# Patient Record
Sex: Female | Born: 1935 | Race: Black or African American | Hispanic: No | Smoking: Never smoker
Health system: Southern US, Community
[De-identification: ages and names within clinical notes are randomized; demographics above are authoritative.]

## PROBLEM LIST (undated history)

## (undated) DIAGNOSIS — E538 Deficiency of other specified B group vitamins: Secondary | ICD-10-CM

## (undated) DIAGNOSIS — G629 Polyneuropathy, unspecified: Secondary | ICD-10-CM

## (undated) DIAGNOSIS — M722 Plantar fascial fibromatosis: Secondary | ICD-10-CM

## (undated) DIAGNOSIS — I10 Essential (primary) hypertension: Secondary | ICD-10-CM

## (undated) DIAGNOSIS — R42 Dizziness and giddiness: Secondary | ICD-10-CM

## (undated) DIAGNOSIS — K219 Gastro-esophageal reflux disease without esophagitis: Secondary | ICD-10-CM

## (undated) DIAGNOSIS — E559 Vitamin D deficiency, unspecified: Secondary | ICD-10-CM

## (undated) DIAGNOSIS — Z972 Presence of dental prosthetic device (complete) (partial): Secondary | ICD-10-CM

## (undated) DIAGNOSIS — M1990 Unspecified osteoarthritis, unspecified site: Secondary | ICD-10-CM

## (undated) DIAGNOSIS — M810 Age-related osteoporosis without current pathological fracture: Secondary | ICD-10-CM

## (undated) DIAGNOSIS — G4700 Insomnia, unspecified: Secondary | ICD-10-CM

## (undated) DIAGNOSIS — Z973 Presence of spectacles and contact lenses: Secondary | ICD-10-CM

## (undated) DIAGNOSIS — E039 Hypothyroidism, unspecified: Secondary | ICD-10-CM

## (undated) DIAGNOSIS — E785 Hyperlipidemia, unspecified: Secondary | ICD-10-CM

## (undated) DIAGNOSIS — D126 Benign neoplasm of colon, unspecified: Secondary | ICD-10-CM

## (undated) HISTORY — PX: TUBAL LIGATION: SHX77

## (undated) HISTORY — DX: Benign neoplasm of colon, unspecified: D12.6

## (undated) HISTORY — DX: Polyneuropathy, unspecified: G62.9

## (undated) HISTORY — DX: Insomnia, unspecified: G47.00

## (undated) HISTORY — DX: Hyperlipidemia, unspecified: E78.5

## (undated) HISTORY — PX: COLONOSCOPY: SHX174

## (undated) HISTORY — PX: CYSTOCELE REPAIR: SHX163

## (undated) HISTORY — DX: Deficiency of other specified B group vitamins: E53.8

## (undated) HISTORY — PX: HAND SURGERY: SHX662

## (undated) HISTORY — DX: Vitamin D deficiency, unspecified: E55.9

## (undated) HISTORY — DX: Plantar fascial fibromatosis: M72.2

## (undated) HISTORY — PX: CORRECTION HAMMER TOE: SUR317

## (undated) HISTORY — PX: RECTOCELE REPAIR: SHX761

## (undated) HISTORY — PX: TOE SURGERY: SHX1073

## (undated) HISTORY — DX: Dizziness and giddiness: R42

## (undated) HISTORY — PX: ABDOMINAL HYSTERECTOMY: SHX81

---

## 1999-07-09 ENCOUNTER — Other Ambulatory Visit: Admission: RE | Admit: 1999-07-09 | Discharge: 1999-07-09 | Payer: Self-pay | Admitting: Obstetrics and Gynecology

## 2000-08-10 ENCOUNTER — Other Ambulatory Visit: Admission: RE | Admit: 2000-08-10 | Discharge: 2000-08-10 | Payer: Self-pay | Admitting: Obstetrics and Gynecology

## 2001-08-25 ENCOUNTER — Other Ambulatory Visit: Admission: RE | Admit: 2001-08-25 | Discharge: 2001-08-25 | Payer: Self-pay | Admitting: Obstetrics and Gynecology

## 2002-04-24 ENCOUNTER — Encounter: Payer: Self-pay | Admitting: Internal Medicine

## 2002-04-24 ENCOUNTER — Ambulatory Visit (HOSPITAL_COMMUNITY): Admission: RE | Admit: 2002-04-24 | Discharge: 2002-04-24 | Payer: Self-pay | Admitting: Internal Medicine

## 2002-04-25 ENCOUNTER — Ambulatory Visit (HOSPITAL_COMMUNITY): Admission: RE | Admit: 2002-04-25 | Discharge: 2002-04-25 | Payer: Self-pay | Admitting: Internal Medicine

## 2002-04-25 ENCOUNTER — Encounter: Payer: Self-pay | Admitting: Internal Medicine

## 2003-01-29 ENCOUNTER — Encounter: Admission: RE | Admit: 2003-01-29 | Discharge: 2003-01-29 | Payer: Self-pay | Admitting: Internal Medicine

## 2003-01-29 ENCOUNTER — Encounter: Payer: Self-pay | Admitting: Internal Medicine

## 2003-03-21 ENCOUNTER — Other Ambulatory Visit: Admission: RE | Admit: 2003-03-21 | Discharge: 2003-03-21 | Payer: Self-pay | Admitting: Obstetrics and Gynecology

## 2003-10-20 HISTORY — PX: TRIGGER FINGER RELEASE: SHX641

## 2004-02-26 ENCOUNTER — Ambulatory Visit (HOSPITAL_BASED_OUTPATIENT_CLINIC_OR_DEPARTMENT_OTHER): Admission: RE | Admit: 2004-02-26 | Discharge: 2004-02-26 | Payer: Self-pay | Admitting: Orthopedic Surgery

## 2004-02-26 ENCOUNTER — Ambulatory Visit (HOSPITAL_COMMUNITY): Admission: RE | Admit: 2004-02-26 | Discharge: 2004-02-26 | Payer: Self-pay | Admitting: Orthopedic Surgery

## 2004-03-31 ENCOUNTER — Ambulatory Visit (HOSPITAL_COMMUNITY): Admission: RE | Admit: 2004-03-31 | Discharge: 2004-03-31 | Payer: Self-pay | Admitting: Obstetrics and Gynecology

## 2004-08-28 ENCOUNTER — Encounter: Admission: RE | Admit: 2004-08-28 | Discharge: 2004-08-28 | Payer: Self-pay | Admitting: Internal Medicine

## 2005-05-05 ENCOUNTER — Other Ambulatory Visit: Admission: RE | Admit: 2005-05-05 | Discharge: 2005-05-05 | Payer: Self-pay | Admitting: Obstetrics and Gynecology

## 2006-05-14 ENCOUNTER — Other Ambulatory Visit: Admission: RE | Admit: 2006-05-14 | Discharge: 2006-05-14 | Payer: Self-pay | Admitting: Obstetrics and Gynecology

## 2006-10-19 HISTORY — PX: SHOULDER ARTHROSCOPY: SHX128

## 2006-11-23 ENCOUNTER — Emergency Department (HOSPITAL_COMMUNITY): Admission: EM | Admit: 2006-11-23 | Discharge: 2006-11-23 | Payer: Self-pay | Admitting: Family Medicine

## 2007-02-04 ENCOUNTER — Encounter: Admission: RE | Admit: 2007-02-04 | Discharge: 2007-02-04 | Payer: Self-pay | Admitting: Orthopedic Surgery

## 2007-02-08 ENCOUNTER — Ambulatory Visit (HOSPITAL_BASED_OUTPATIENT_CLINIC_OR_DEPARTMENT_OTHER): Admission: RE | Admit: 2007-02-08 | Discharge: 2007-02-09 | Payer: Self-pay | Admitting: Orthopedic Surgery

## 2008-06-07 ENCOUNTER — Other Ambulatory Visit: Admission: RE | Admit: 2008-06-07 | Discharge: 2008-06-07 | Payer: Self-pay | Admitting: Obstetrics and Gynecology

## 2008-12-19 ENCOUNTER — Ambulatory Visit: Payer: Self-pay | Admitting: Internal Medicine

## 2008-12-19 DIAGNOSIS — K3184 Gastroparesis: Secondary | ICD-10-CM | POA: Insufficient documentation

## 2008-12-19 DIAGNOSIS — R1319 Other dysphagia: Secondary | ICD-10-CM | POA: Insufficient documentation

## 2008-12-20 ENCOUNTER — Encounter: Payer: Self-pay | Admitting: Internal Medicine

## 2008-12-20 ENCOUNTER — Ambulatory Visit: Payer: Self-pay | Admitting: Internal Medicine

## 2008-12-26 DIAGNOSIS — E108 Type 1 diabetes mellitus with unspecified complications: Secondary | ICD-10-CM

## 2008-12-26 DIAGNOSIS — E1065 Type 1 diabetes mellitus with hyperglycemia: Secondary | ICD-10-CM

## 2008-12-26 DIAGNOSIS — E1122 Type 2 diabetes mellitus with diabetic chronic kidney disease: Secondary | ICD-10-CM | POA: Insufficient documentation

## 2009-01-04 ENCOUNTER — Ambulatory Visit (HOSPITAL_COMMUNITY): Admission: RE | Admit: 2009-01-04 | Discharge: 2009-01-04 | Payer: Self-pay | Admitting: Internal Medicine

## 2009-02-04 ENCOUNTER — Ambulatory Visit: Payer: Self-pay | Admitting: Internal Medicine

## 2009-02-04 DIAGNOSIS — K5900 Constipation, unspecified: Secondary | ICD-10-CM | POA: Insufficient documentation

## 2009-08-08 ENCOUNTER — Ambulatory Visit: Payer: Self-pay | Admitting: Internal Medicine

## 2009-08-08 DIAGNOSIS — R1031 Right lower quadrant pain: Secondary | ICD-10-CM | POA: Insufficient documentation

## 2009-08-20 ENCOUNTER — Encounter: Payer: Self-pay | Admitting: Internal Medicine

## 2009-08-20 ENCOUNTER — Ambulatory Visit: Payer: Self-pay | Admitting: Internal Medicine

## 2009-08-23 ENCOUNTER — Encounter: Payer: Self-pay | Admitting: Internal Medicine

## 2010-11-28 ENCOUNTER — Ambulatory Visit: Payer: Medicare Other | Attending: Internal Medicine | Admitting: Physical Therapy

## 2010-11-28 DIAGNOSIS — R262 Difficulty in walking, not elsewhere classified: Secondary | ICD-10-CM | POA: Insufficient documentation

## 2010-11-28 DIAGNOSIS — M6281 Muscle weakness (generalized): Secondary | ICD-10-CM | POA: Insufficient documentation

## 2010-11-28 DIAGNOSIS — IMO0001 Reserved for inherently not codable concepts without codable children: Secondary | ICD-10-CM | POA: Insufficient documentation

## 2010-12-02 ENCOUNTER — Ambulatory Visit: Payer: Medicare Other | Admitting: Physical Therapy

## 2010-12-04 ENCOUNTER — Ambulatory Visit: Payer: Medicare Other | Admitting: Physical Therapy

## 2010-12-08 ENCOUNTER — Ambulatory Visit: Payer: Medicare Other | Admitting: Physical Therapy

## 2010-12-16 ENCOUNTER — Ambulatory Visit: Payer: Medicare Other | Admitting: Physical Therapy

## 2010-12-18 ENCOUNTER — Ambulatory Visit: Payer: Medicare Other | Attending: Internal Medicine | Admitting: Physical Therapy

## 2010-12-18 DIAGNOSIS — M6281 Muscle weakness (generalized): Secondary | ICD-10-CM | POA: Insufficient documentation

## 2010-12-18 DIAGNOSIS — IMO0001 Reserved for inherently not codable concepts without codable children: Secondary | ICD-10-CM | POA: Insufficient documentation

## 2010-12-18 DIAGNOSIS — R262 Difficulty in walking, not elsewhere classified: Secondary | ICD-10-CM | POA: Insufficient documentation

## 2010-12-23 ENCOUNTER — Ambulatory Visit: Payer: Medicare Other | Admitting: Physical Therapy

## 2010-12-25 ENCOUNTER — Ambulatory Visit: Payer: Medicare Other | Admitting: Physical Therapy

## 2011-01-21 LAB — GLUCOSE, CAPILLARY
Glucose-Capillary: 102 mg/dL — ABNORMAL HIGH (ref 70–99)
Glucose-Capillary: 122 mg/dL — ABNORMAL HIGH (ref 70–99)

## 2011-01-29 LAB — GLUCOSE, CAPILLARY
Glucose-Capillary: 81 mg/dL (ref 70–99)
Glucose-Capillary: 85 mg/dL (ref 70–99)

## 2011-03-06 NOTE — Op Note (Signed)
NAMEJANY, Gabriela Jefferson              ACCOUNT NO.:  [id number]   MEDICAL RECORD NO.:  [id number]          PATIENT TYPE:  AMB   LOCATION:  DSC                          FACILITY:  [facility]   PHYSICIAN:  Katy Fitch. Sypher, M.D. DATE OF BIRTH:  [birth year]   DATE OF PROCEDURE:  02/08/2007  DATE OF DISCHARGE:                               OPERATIVE REPORT   PREOPERATIVE DIAGNOSIS:  Chronic stage III impingement left shoulder  with MRI evidence of full-thickness supraspinous rotator cuff tear and  extensive partial-thickness rotator cuff tear involving supraspinatus  and infraspinatus and partial subscapularis tear.  Also unfavorable AC  anatomy with a type 3 anterior acromion and medial distal clavicle  osteophyte.   POSTOPERATIVE DIAGNOSIS:  Chronic stage III impingement left shoulder  with MRI evidence of full-thickness supraspinous rotator cuff tear and  extensive partial-thickness rotator cuff tear involving supraspinatus  and infraspinatus and partial subscapularis tear.  Also unfavorable AC  anatomy with a type 3 anterior acromion and medial distal clavicle  osteophyte, with confirmation of 95% full-thickness tear of anterior  supraspinatus and extensive partial-thickness tearing of supraspinatus,  infraspinatus and subscapularis.   OPERATION:  1. Diagnostic arthroscopy, left glenohumeral joint.  2. Arthroscopic debridement of subscapularis, supraspinatus,      infraspinatus tendons.  3. Open subacromial decompression with coracoacromial ligament release      acromioplasty, resection of medial acromial osteophyte.  4. Open resection of distal clavicle.  5. Repair of supraspinatus anterior rotator cuff tear with      biocorkscrew anchor and decortication of greater tuberosity.   OPERATING SURGEON:  Josephine Igo, M.D.   ASSISTANT:  Molly Maduro Dasnoit PA-C.   ANESTHESIA:  General endotracheal supplemented by a left interscalene  block, supervising anesthesiologist is Dr. Noreene Larsson.   INDICATIONS:  Gabriela Jefferson is a 77 year old woman with type 2 diabetes  and hypertension referred by Dr. Creola Corn for evaluation and  management of a painful left shoulder.  Clinical examination revealed  pain on abduction external rotation, weakness of abduction consistent  with possible rotator cuff tear.  An MRI was obtained at [facility]  on January 01, 2007 interpreted by Dr. Frederica Kuster to reveal  evidence of a full-thickness perforation across the distal anterior  supraspinatus tendon, superimposed on extensive high-grade partial  thickness articular surface tear of supraspinatus, infraspinatus and  subscapularis.   Dr. Suzie Portela also documented significant AC arthropathy that was well  visualized on plain x-ray.   Due to failure to respond to nonoperative measures.  Gabriela Jefferson is  brought to the operating this time for diagnostic arthroscopy of her  left glenohumeral joint, anticipating debridement of rotator cuff,  subacromial decompression and probable resection of distal clavicle and  probable repair of the rotator cuff.   After informed consent, she is brought to the operating this time.  Preoperatively she was advised potential risks, benefits surgery.  She  is evaluated by Dr. Noreene Larsson preoperatively for anesthesia consult.  Her  diabetes was noted be in moderate control.  Her blood pressure was in  control.   Preoperatively questions invited, answered.   PROCEDURE:  Almon Register  is brought to the operating room and placed  in supine position on the operating table.   Following an anesthesia consult by Dr. Noreene Larsson, an interscalene block was  placed holding area with excellent anesthesia of left arm and  forequarter.  Gabriela Jefferson was then transferred to room 1, placed supine  position on the table and under Dr. Morley Kos direct supervision, general  endotracheal anesthesia induced.   Gabriela Jefferson was carefully positioned in the beach-chair position with the  aid  of a torso and head holder designed for shoulder arthroscopy.  The  left arm was prepped with DuraPrep and draped with impervious  arthroscopy drapes.   The arthroscope was introduced through a standard posterior viewing  portal.   Diagnostic arthroscopy of the left glenohumeral joint confirmed near  normal hyaline articular cartilage surfaces of the glenoid and humeral  head.  There is extensive labral degenerative tearing which required  debridement with a suction shaver brought in anteriorly.  The long head  biceps had a stable anchor at the superior glenoid and biceps tendon was  normal to the rotator interval.   There was a moderate degree of degenerative tearing of the subscapularis  on the articular surface of the tendon.  This was not full-thickness.  The capsule was intact.  The deep surface of the supraspinatus,  infraspinatus was well visualized.  There was about a 60-90% tear of the  anterior supraspinatus with a small full-thickness perforation apparent.  The deep surface of the infraspinatus revealed a 50% deep surface tear.   A suction shaver was used to remove all fragments of tendon hanging  within the joint.  After photographic documentation of the tendon  pathology, the arthroscope was removed from glenohumeral joint and  proceeded directly to open repair of the cuff with subacromial  decompression, distal clavicle resection.   A 7 cm incision was fashioned from the distal clavicle across the  anterior acromion.  The anterior and middle thirds of deltoid was split.  The bursa was entered.  There was a very significant ossification of  coracoacromial ligament that was identified, subsequently resected with  a rongeur.  The type 3 acromion was corrected with the use of an  oscillating saw and a large rongeur was used to remove the medial  osteophyte at the medial acromial portion of the [facility] joint.  The distal 15 mm of clavicle was exposed subperiosteally and the  distal clavicle  removed the oscillating saw.   The bursa was visually inspected and palpated digitally.  The adhesions  were released.  The rotator cuff tear region was inspected.  There is a  thin bursal layer still protecting the rotator cuff.  A longitudinal  incision was fashioned 1 cm posterior to the biceps interval followed by  thorough debridement of degenerative rotator cuff tendons in the  watershed region.  The greater tuberosity was decorticated by hand with  a curette followed by placement of single bio corkscrew anchor.  A  baseball stitch type closure was used to close the incision and reinsert  the supraspinatus into the decorticated greater tuberosity.  A very  satisfactory low-profile repair was achieved.   After power irrigation of the AC region bursa and subacromial space, the  edge of the acromion was removed with a hand rasp followed by repair of  the anterior third of deltoid to the capsule of the [facility] joint and  trapezius muscle closing the dead space at the site of the distal  clavicle removal  followed by repair of the anterior third of deltoid  anatomically to the periosteum of the acromion.   A finishing suture of 0 Vicryl was used to close the deltoid split and  to reinforce the repair of the deltoid to the acromion and AC capsule.   The wound was then irrigated followed by repair of the skin with  subdermal sutures of 2-0 Vicryl and intradermal 3-0 Prolene with Steri-  Strips.   There no apparent complications.   Gabriela Jefferson tolerated surgery anesthesia well.  She is transferred  recovery room with stable signs..  She will be admitted to recovery care  center for observation vital signs and analgesics the form of p.o. and  IV Dilaudid and IV PCA morphine.   She will be discharged 24 hours care her family with a prescriptions for  Dilaudid 2 mg one p.o. every 4 to 6 hours as needed for pain 30 tablets  without refill, Motrin 600 mg one p.o. q.6 h as  needed for pain 30  tablets one refill and Keflex 500 mg one p.o. q. 8 hours x4 days as a  prophylactic antibiotic.      Katy Fitch Sypher, M.D.  Electronically Signed     RVS/MEDQ  D:  02/08/2007  T:  02/08/2007  Job:  [id number]   cc:   Gwen Pounds, MD

## 2011-03-06 NOTE — Op Note (Signed)
NAME:  Gabriela Jefferson, Gabriela Jefferson                        ACCOUNT NO.:  [id number]   MEDICAL RECORD NO.:  [id number]                   PATIENT TYPE:  AMB   LOCATION:  DSC                                  FACILITY:  [facility]   PHYSICIAN:  Katy Fitch. Naaman Plummer., M.D.          DATE OF BIRTH:  Apr 27, 1932   DATE OF PROCEDURE:  02/26/2004  DATE OF DISCHARGE:                                 OPERATIVE REPORT   PREOPERATIVE DIAGNOSIS:  Locking stenosing tenosynovitis, left ring finger  at A-1 pulley.   POSTOPERATIVE DIAGNOSIS:  Locking stenosing tenosynovitis, left ring finger  at A-1 pulley.   OPERATION PERFORMED:  Release of left ring finger A-1 pulley.   SURGEON:  Katy Fitch. Sypher, M.D.   ASSISTANT:  Jonni Sanger, P.A.   ANESTHESIA:  0.25% Marcaine and 2% lidocaine, metacarpal head level block of  left ring finger supplemented by IV sedation.   SUPERVISING ANESTHESIOLOGIST:  Judie Petit, M.D.   INDICATIONS FOR PROCEDURE:  Gabriela Jefferson is a 77 year old woman referred  by Dr. Timothy Lasso of [facility] for evaluation of stenosing  tenosynovitis.  Gabriela Jefferson has had chronic locking of her left ring finger.  This  has not responded to nonoperative measures.  Gabriela Jefferson requested release of the A-  1 pulley.  Gabriela Jefferson has had previous surgery to the right hand for this  predicament.  After informed consent Gabriela Jefferson is brought to the operating room.   DESCRIPTION OF PROCEDURE:  Gabriela Jefferson was brought to the operating room  and placed in supine position on the operating table.  Following light  sedation, the left arm was prepped with Betadine soap and solution and  sterilely draped.  Following exsanguination of the left arm with an Esmarch  bandage, an arterial tourniquet on the proximal brachium to 250 mmHg.  The  procedure commenced with a short oblique incision distal to the palmar  crease.  Subcutaneous tissues were carefully divided taking care to identify  the A-1 pulley.  This was gently  exposed with use of blunt retractors.  The  pulley was then split in the midline releasing the A-1 pulley completely.  Thereafter full range of motion of the finger was recovered.  The tendons  were delivered and a minor synovectomy was performed.  The wound was then  repaired with interrupted suture of 5-0 nylon.  A compressive dressing was  applied with Xeroflo, sterile gauze and Ace wrap.   Gabriela Jefferson was advised to begin immediate range of motion exercises.  Gabriela Jefferson  will return to the office for follow-up in one week for wound evaluation and  advancement to an exercise program.                                               Katy Fitch. Naaman Plummer., M.D.  RVS/MEDQ  D:  02/26/2004  T:  02/26/2004  Job:  [id number]   cc:   Gwen Pounds, M.D.  [address]  [place]  [place] [address]  Fax: [phone]

## 2011-04-22 ENCOUNTER — Emergency Department (HOSPITAL_COMMUNITY)
Admission: EM | Admit: 2011-04-22 | Discharge: 2011-04-22 | Disposition: A | Discharge status: Home / Self Care | Payer: Medicare Other | Attending: Emergency Medicine | Admitting: Emergency Medicine

## 2011-04-22 ENCOUNTER — Emergency Department (HOSPITAL_COMMUNITY): Payer: Medicare Other

## 2011-04-22 DIAGNOSIS — J3489 Other specified disorders of nose and nasal sinuses: Secondary | ICD-10-CM | POA: Insufficient documentation

## 2011-04-22 DIAGNOSIS — J45909 Unspecified asthma, uncomplicated: Secondary | ICD-10-CM | POA: Insufficient documentation

## 2011-04-22 DIAGNOSIS — IMO0001 Reserved for inherently not codable concepts without codable children: Secondary | ICD-10-CM | POA: Insufficient documentation

## 2011-04-22 DIAGNOSIS — Z79899 Other long term (current) drug therapy: Secondary | ICD-10-CM | POA: Insufficient documentation

## 2011-04-22 DIAGNOSIS — M810 Age-related osteoporosis without current pathological fracture: Secondary | ICD-10-CM | POA: Insufficient documentation

## 2011-04-22 DIAGNOSIS — E039 Hypothyroidism, unspecified: Secondary | ICD-10-CM | POA: Insufficient documentation

## 2011-04-22 DIAGNOSIS — E7800 Pure hypercholesterolemia, unspecified: Secondary | ICD-10-CM | POA: Insufficient documentation

## 2011-04-22 DIAGNOSIS — I10 Essential (primary) hypertension: Secondary | ICD-10-CM | POA: Insufficient documentation

## 2011-04-22 DIAGNOSIS — M129 Arthropathy, unspecified: Secondary | ICD-10-CM | POA: Insufficient documentation

## 2011-04-22 DIAGNOSIS — R6883 Chills (without fever): Secondary | ICD-10-CM | POA: Insufficient documentation

## 2011-04-22 DIAGNOSIS — R059 Cough, unspecified: Secondary | ICD-10-CM | POA: Insufficient documentation

## 2011-04-22 DIAGNOSIS — E119 Type 2 diabetes mellitus without complications: Secondary | ICD-10-CM | POA: Insufficient documentation

## 2011-04-22 DIAGNOSIS — R05 Cough: Secondary | ICD-10-CM | POA: Insufficient documentation

## 2011-04-22 LAB — DIFFERENTIAL
Basophils Absolute: 0 10*3/uL (ref 0.0–0.1)
Basophils Relative: 0 % (ref 0–1)
Eosinophils Absolute: 0 10*3/uL (ref 0.0–0.7)
Eosinophils Relative: 0 % (ref 0–5)
Lymphocytes Relative: 29 % (ref 12–46)
Lymphs Abs: 2.1 10*3/uL (ref 0.7–4.0)
Monocytes Absolute: 0.7 10*3/uL (ref 0.1–1.0)
Monocytes Relative: 10 % (ref 3–12)
Neutro Abs: 4.4 10*3/uL (ref 1.7–7.7)
Neutrophils Relative %: 61 % (ref 43–77)

## 2011-04-22 LAB — BASIC METABOLIC PANEL
BUN: 13 mg/dL (ref 6–23)
CO2: 26 mEq/L (ref 19–32)
Calcium: 9.2 mg/dL (ref 8.4–10.5)
Chloride: 101 mEq/L (ref 96–112)
Creatinine, Ser: 0.94 mg/dL (ref 0.50–1.10)
GFR calc Af Amer: >60 mL/min (ref >60)
GFR calc non Af Amer: 58 mL/min — ABNORMAL LOW (ref >60)
Glucose, Bld: 175 mg/dL — ABNORMAL HIGH (ref 70–99)
Potassium: 4.1 mEq/L (ref 3.5–5.1)
Sodium: 138 mEq/L (ref 135–145)

## 2011-04-22 LAB — CBC
HCT: 34.6 % — ABNORMAL LOW (ref 36.0–46.0)
Hemoglobin: 11.7 g/dL — ABNORMAL LOW (ref 12.0–15.0)
MCH: 30.2 pg (ref 26.0–34.0)
MCHC: 33.8 g/dL (ref 30.0–36.0)
MCV: 89.4 fL (ref 78.0–100.0)
Platelets: 226 10*3/uL (ref 150–400)
RBC: 3.87 MIL/uL (ref 3.87–5.11)
RDW: 12.8 % (ref 11.5–15.5)
WBC: 7.2 10*3/uL (ref 4.0–10.5)

## 2011-11-27 DIAGNOSIS — L84 Corns and callosities: Secondary | ICD-10-CM | POA: Diagnosis not present

## 2011-11-27 DIAGNOSIS — L608 Other nail disorders: Secondary | ICD-10-CM | POA: Diagnosis not present

## 2011-11-27 DIAGNOSIS — E119 Type 2 diabetes mellitus without complications: Secondary | ICD-10-CM | POA: Diagnosis not present

## 2011-12-29 DIAGNOSIS — M899 Disorder of bone, unspecified: Secondary | ICD-10-CM | POA: Diagnosis not present

## 2011-12-29 DIAGNOSIS — E039 Hypothyroidism, unspecified: Secondary | ICD-10-CM | POA: Diagnosis not present

## 2011-12-29 DIAGNOSIS — E559 Vitamin D deficiency, unspecified: Secondary | ICD-10-CM | POA: Diagnosis not present

## 2011-12-29 DIAGNOSIS — E785 Hyperlipidemia, unspecified: Secondary | ICD-10-CM | POA: Diagnosis not present

## 2011-12-29 DIAGNOSIS — M949 Disorder of cartilage, unspecified: Secondary | ICD-10-CM | POA: Diagnosis not present

## 2011-12-29 DIAGNOSIS — E119 Type 2 diabetes mellitus without complications: Secondary | ICD-10-CM | POA: Diagnosis not present

## 2011-12-29 DIAGNOSIS — R82998 Other abnormal findings in urine: Secondary | ICD-10-CM | POA: Diagnosis not present

## 2011-12-29 DIAGNOSIS — E538 Deficiency of other specified B group vitamins: Secondary | ICD-10-CM | POA: Diagnosis not present

## 2012-01-05 DIAGNOSIS — Z0000 Encounter for general adult medical examination without abnormal findings: Secondary | ICD-10-CM | POA: Diagnosis not present

## 2012-01-05 DIAGNOSIS — E119 Type 2 diabetes mellitus without complications: Secondary | ICD-10-CM | POA: Diagnosis not present

## 2012-01-05 DIAGNOSIS — E785 Hyperlipidemia, unspecified: Secondary | ICD-10-CM | POA: Diagnosis not present

## 2012-01-05 DIAGNOSIS — M810 Age-related osteoporosis without current pathological fracture: Secondary | ICD-10-CM | POA: Diagnosis not present

## 2012-01-13 DIAGNOSIS — M19049 Primary osteoarthritis, unspecified hand: Secondary | ICD-10-CM | POA: Diagnosis not present

## 2012-01-13 DIAGNOSIS — M6530 Trigger finger, unspecified finger: Secondary | ICD-10-CM | POA: Diagnosis not present

## 2012-01-27 DIAGNOSIS — M19049 Primary osteoarthritis, unspecified hand: Secondary | ICD-10-CM | POA: Diagnosis not present

## 2012-01-27 DIAGNOSIS — M6530 Trigger finger, unspecified finger: Secondary | ICD-10-CM | POA: Diagnosis not present

## 2012-02-02 DIAGNOSIS — Z1212 Encounter for screening for malignant neoplasm of rectum: Secondary | ICD-10-CM | POA: Diagnosis not present

## 2012-02-11 DIAGNOSIS — Z1231 Encounter for screening mammogram for malignant neoplasm of breast: Secondary | ICD-10-CM | POA: Diagnosis not present

## 2012-03-08 DIAGNOSIS — E119 Type 2 diabetes mellitus without complications: Secondary | ICD-10-CM | POA: Diagnosis not present

## 2012-03-08 DIAGNOSIS — L608 Other nail disorders: Secondary | ICD-10-CM | POA: Diagnosis not present

## 2012-03-08 DIAGNOSIS — Q6689 Other  specified congenital deformities of feet: Secondary | ICD-10-CM | POA: Diagnosis not present

## 2012-03-08 DIAGNOSIS — L84 Corns and callosities: Secondary | ICD-10-CM | POA: Diagnosis not present

## 2012-05-05 DIAGNOSIS — I10 Essential (primary) hypertension: Secondary | ICD-10-CM | POA: Diagnosis not present

## 2012-05-05 DIAGNOSIS — K219 Gastro-esophageal reflux disease without esophagitis: Secondary | ICD-10-CM | POA: Diagnosis not present

## 2012-05-05 DIAGNOSIS — K5909 Other constipation: Secondary | ICD-10-CM | POA: Diagnosis not present

## 2012-05-05 DIAGNOSIS — E119 Type 2 diabetes mellitus without complications: Secondary | ICD-10-CM | POA: Diagnosis not present

## 2012-05-10 DIAGNOSIS — E119 Type 2 diabetes mellitus without complications: Secondary | ICD-10-CM | POA: Diagnosis not present

## 2012-05-10 DIAGNOSIS — L608 Other nail disorders: Secondary | ICD-10-CM | POA: Diagnosis not present

## 2012-05-10 DIAGNOSIS — I872 Venous insufficiency (chronic) (peripheral): Secondary | ICD-10-CM | POA: Diagnosis not present

## 2012-05-10 DIAGNOSIS — M79609 Pain in unspecified limb: Secondary | ICD-10-CM | POA: Diagnosis not present

## 2012-05-18 DIAGNOSIS — E119 Type 2 diabetes mellitus without complications: Secondary | ICD-10-CM | POA: Diagnosis not present

## 2012-05-19 DIAGNOSIS — E039 Hypothyroidism, unspecified: Secondary | ICD-10-CM | POA: Diagnosis not present

## 2012-05-19 DIAGNOSIS — I10 Essential (primary) hypertension: Secondary | ICD-10-CM | POA: Diagnosis not present

## 2012-05-19 DIAGNOSIS — K5909 Other constipation: Secondary | ICD-10-CM | POA: Diagnosis not present

## 2012-05-19 DIAGNOSIS — K219 Gastro-esophageal reflux disease without esophagitis: Secondary | ICD-10-CM | POA: Diagnosis not present

## 2012-06-27 DIAGNOSIS — M6530 Trigger finger, unspecified finger: Secondary | ICD-10-CM | POA: Diagnosis not present

## 2012-07-06 ENCOUNTER — Encounter (HOSPITAL_BASED_OUTPATIENT_CLINIC_OR_DEPARTMENT_OTHER): Payer: Self-pay | Admitting: *Deleted

## 2012-07-06 NOTE — Progress Notes (Signed)
Going to dr Timothy Lasso in am for lab-called them to see if they would get bmet and fax here. Pt to come in for ekg Has been here for TF and shoulder surgery

## 2012-07-07 DIAGNOSIS — Z23 Encounter for immunization: Secondary | ICD-10-CM | POA: Diagnosis not present

## 2012-07-07 DIAGNOSIS — E119 Type 2 diabetes mellitus without complications: Secondary | ICD-10-CM | POA: Diagnosis not present

## 2012-07-07 DIAGNOSIS — I10 Essential (primary) hypertension: Secondary | ICD-10-CM | POA: Diagnosis not present

## 2012-07-07 DIAGNOSIS — E039 Hypothyroidism, unspecified: Secondary | ICD-10-CM | POA: Diagnosis not present

## 2012-07-07 DIAGNOSIS — E785 Hyperlipidemia, unspecified: Secondary | ICD-10-CM | POA: Diagnosis not present

## 2012-07-07 DIAGNOSIS — E538 Deficiency of other specified B group vitamins: Secondary | ICD-10-CM | POA: Diagnosis not present

## 2012-07-08 DIAGNOSIS — L608 Other nail disorders: Secondary | ICD-10-CM | POA: Diagnosis not present

## 2012-07-08 DIAGNOSIS — E119 Type 2 diabetes mellitus without complications: Secondary | ICD-10-CM | POA: Diagnosis not present

## 2012-07-08 DIAGNOSIS — L84 Corns and callosities: Secondary | ICD-10-CM | POA: Diagnosis not present

## 2012-07-11 NOTE — H&P (Signed)
  Gabriela Jefferson is an 78 y.o. female.   Chief Complaint: c/o chronic and progressive STS symptoms right long finger HPI: [name] noted significant pain at the base of her right thumb and triggering of her right long finger which had been present for three months.  She is now referred for an upper extremity orthopaedic consult.     Past Medical History  Diagnosis Date  . Hypertension   . Diabetes mellitus   . Arthritis   . Osteoporosis   . GERD (gastroesophageal reflux disease)   . Hypothyroidism   . Wears glasses   . Wears dentures     upper    Past Surgical History  Procedure Date  . Shoulder arthroscopy 2008    left  . Trigger finger release 2005    lt ring  . Cystocele repair   . Rectocele repair   . Abdominal hysterectomy   . Correction hammer toe     left foot  . Tubal ligation   . Colonoscopy     No family history on file. Social History:  reports that she has never smoked. She does not have any smokeless tobacco history on file. She reports that she does not drink alcohol or use illicit drugs.  Allergies: No Known Allergies  No prescriptions prior to admission    No results found for this or any previous visit (from the past 48 hour(s)).  No results found.   Pertinent items are noted in HPI.  Height 5\' 7"  (1.702 m), weight 86.183 kg (190 lb).  General appearance: alert Head: Normocephalic, without obvious abnormality Neck: supple, symmetrical, trachea midline Resp: clear to auscultation bilaterally Cardio: regular rate and rhythm GI: normal findings: bowel sounds normal Extremities:.  Inspection of her hand reveals stigmata of osteoarthritis including thumb shouldering bilaterally.  She has pain with CMC translation and grind on the right. She has Heberden's nodes.  She has active triggering of her right long finger in flexion. She has full range of motion of her index, ring and small fingers on the right, all fingers and thumb on the left.  Her pulse  and cap refill are intact.  She has no sign of stenosing tenosynovitis of the first dorsal compartment. She has full range of motion of her left shoulder without pain.    X-rays of her right hand demonstrate Eaton stage III CMC arthrosis.  She has narrowing of her index MP joint and distal interphalangeal joint narrowing, thumb IP joint narrowing.    Pulses: 2+ and symmetric Skin: normal Neurologic: Grossly normal    ASSESSMENT:     1)  Background osteoarthritis.       2)  Stenosing tenosynovitis right long finger.    3)  CMC arthrosis right thumb. Plan: To the OR for release A-1 pulley right long finger and injection of right thumb CMC joint.The procedure, risks,benefits and post-op course were discussed with the patient at length and they were in agreement with the plan.     DASNOIT,Jacob Cicero J 07/11/2012, 4:49 PM    H&P documentation: 07/12/2012  -History and Physical Reviewed  -Patient has been re-examined  -No change in the plan of care  Wyn Forster, MD

## 2012-07-12 ENCOUNTER — Encounter (HOSPITAL_BASED_OUTPATIENT_CLINIC_OR_DEPARTMENT_OTHER): Payer: Self-pay | Admitting: Anesthesiology

## 2012-07-12 ENCOUNTER — Encounter (HOSPITAL_BASED_OUTPATIENT_CLINIC_OR_DEPARTMENT_OTHER)
Admission: RE | Disposition: A | Discharge status: Home / Self Care | Payer: Self-pay | Source: Ambulatory Visit | Attending: Orthopedic Surgery

## 2012-07-12 ENCOUNTER — Ambulatory Visit (HOSPITAL_BASED_OUTPATIENT_CLINIC_OR_DEPARTMENT_OTHER)
Admission: RE | Admit: 2012-07-12 | Discharge: 2012-07-12 | Disposition: A | Discharge status: Home / Self Care | Payer: Medicare Other | Source: Ambulatory Visit | Attending: Orthopedic Surgery | Admitting: Orthopedic Surgery

## 2012-07-12 ENCOUNTER — Ambulatory Visit (HOSPITAL_BASED_OUTPATIENT_CLINIC_OR_DEPARTMENT_OTHER): Payer: Medicare Other | Admitting: Anesthesiology

## 2012-07-12 ENCOUNTER — Encounter (HOSPITAL_BASED_OUTPATIENT_CLINIC_OR_DEPARTMENT_OTHER): Payer: Self-pay | Admitting: *Deleted

## 2012-07-12 DIAGNOSIS — M65849 Other synovitis and tenosynovitis, unspecified hand: Secondary | ICD-10-CM | POA: Diagnosis not present

## 2012-07-12 DIAGNOSIS — I4891 Unspecified atrial fibrillation: Secondary | ICD-10-CM | POA: Diagnosis not present

## 2012-07-12 DIAGNOSIS — M6240 Contracture of muscle, unspecified site: Secondary | ICD-10-CM | POA: Insufficient documentation

## 2012-07-12 DIAGNOSIS — M19049 Primary osteoarthritis, unspecified hand: Secondary | ICD-10-CM | POA: Diagnosis not present

## 2012-07-12 DIAGNOSIS — E119 Type 2 diabetes mellitus without complications: Secondary | ICD-10-CM | POA: Insufficient documentation

## 2012-07-12 DIAGNOSIS — M65839 Other synovitis and tenosynovitis, unspecified forearm: Secondary | ICD-10-CM | POA: Diagnosis not present

## 2012-07-12 DIAGNOSIS — M6530 Trigger finger, unspecified finger: Secondary | ICD-10-CM | POA: Diagnosis not present

## 2012-07-12 HISTORY — DX: Unspecified osteoarthritis, unspecified site: M19.90

## 2012-07-12 HISTORY — PX: TRIGGER FINGER RELEASE: SHX641

## 2012-07-12 HISTORY — PX: STERIOD INJECTION: SHX5046

## 2012-07-12 HISTORY — DX: Gastro-esophageal reflux disease without esophagitis: K21.9

## 2012-07-12 HISTORY — DX: Essential (primary) hypertension: I10

## 2012-07-12 HISTORY — DX: Hypothyroidism, unspecified: E03.9

## 2012-07-12 HISTORY — DX: Age-related osteoporosis without current pathological fracture: M81.0

## 2012-07-12 HISTORY — DX: Presence of spectacles and contact lenses: Z97.3

## 2012-07-12 HISTORY — DX: Presence of dental prosthetic device (complete) (partial): Z97.2

## 2012-07-12 LAB — POCT I-STAT, CHEM 8
BUN: 17 mg/dL (ref 6–23)
Calcium, Ion: 1.14 mmol/L (ref 1.13–1.30)
Chloride: 105 mEq/L (ref 96–112)
Creatinine, Ser: 1 mg/dL (ref 0.50–1.10)
Glucose, Bld: 111 mg/dL — ABNORMAL HIGH (ref 70–99)
Potassium: 3.8 mEq/L (ref 3.5–5.1)

## 2012-07-12 SURGERY — RELEASE, A1 PULLEY, FOR TRIGGER FINGER
Anesthesia: Monitor Anesthesia Care | Site: Thumb | Laterality: Right | Wound class: Clean

## 2012-07-12 MED ORDER — OXYCODONE HCL 5 MG/5ML PO SOLN: 5.0000 mg | Freq: Once | ORAL | Status: DC | PRN

## 2012-07-12 MED ORDER — DEXAMETHASONE SODIUM PHOSPHATE 10 MG/ML IJ SOLN
INTRAMUSCULAR | Status: DC | PRN
  Administered 2012-07-12: 10 mg via INTRAVENOUS

## 2012-07-12 MED ORDER — FENTANYL CITRATE 0.05 MG/ML IJ SOLN
INTRAMUSCULAR | Status: DC | PRN
  Administered 2012-07-12 (×2): 50 ug via INTRAVENOUS

## 2012-07-12 MED ORDER — METHYLPREDNISOLONE ACETATE 40 MG/ML IJ SUSP
INTRAMUSCULAR | Status: DC | PRN
  Administered 2012-07-12: 40 mg via INTRA_ARTICULAR

## 2012-07-12 MED ORDER — ONDANSETRON HCL 4 MG/2ML IJ SOLN
INTRAMUSCULAR | Status: DC | PRN
  Administered 2012-07-12: 4 mg via INTRAVENOUS

## 2012-07-12 MED ORDER — LIDOCAINE HCL 2 % IJ SOLN
INTRAMUSCULAR | Status: DC | PRN
  Administered 2012-07-12: 2 mL

## 2012-07-12 MED ORDER — LACTATED RINGERS IV SOLN
INTRAVENOUS | Status: DC
  Administered 2012-07-12: 07:00:00 via INTRAVENOUS

## 2012-07-12 MED ORDER — DROPERIDOL 2.5 MG/ML IJ SOLN: 0.6250 mg | INTRAMUSCULAR | Status: DC | PRN

## 2012-07-12 MED ORDER — OXYCODONE HCL 5 MG PO TABS: 5.0000 mg | ORAL_TABLET | Freq: Once | ORAL | Status: DC | PRN

## 2012-07-12 MED ORDER — PROPOFOL 10 MG/ML IV BOLUS
INTRAVENOUS | Status: DC | PRN
  Administered 2012-07-12: 60 mg via INTRAVENOUS

## 2012-07-12 MED ORDER — TRAMADOL HCL 50 MG PO TABS: ORAL_TABLET | ORAL | Status: DC

## 2012-07-12 MED ORDER — LIDOCAINE HCL (CARDIAC) 20 MG/ML IV SOLN
INTRAVENOUS | Status: DC | PRN
  Administered 2012-07-12: 50 mg via INTRAVENOUS

## 2012-07-12 MED ORDER — HYDROMORPHONE HCL PF 1 MG/ML IJ SOLN: 0.2500 mg | INTRAMUSCULAR | Status: DC | PRN

## 2012-07-12 SURGICAL SUPPLY — 40 items
BANDAGE ADHESIVE 1X3 (GAUZE/BANDAGES/DRESSINGS) ×4 IMPLANT
BLADE SURG 15 STRL LF DISP TIS (BLADE) ×2 IMPLANT
BLADE SURG 15 STRL SS (BLADE) ×3
BNDG CMPR 9X4 STRL LF SNTH (GAUZE/BANDAGES/DRESSINGS) ×2
BNDG CMPR MD 5X2 ELC HKLP STRL (GAUZE/BANDAGES/DRESSINGS) ×2
BNDG ELASTIC 2 VLCR STRL LF (GAUZE/BANDAGES/DRESSINGS) ×3 IMPLANT
BNDG ESMARK 4X9 LF (GAUZE/BANDAGES/DRESSINGS) ×1 IMPLANT
BRUSH SCRUB EZ PLAIN DRY (MISCELLANEOUS) ×3 IMPLANT
CLOTH BEACON ORANGE TIMEOUT ST (SAFETY) ×3 IMPLANT
CORDS BIPOLAR (ELECTRODE) ×2 IMPLANT
COVER MAYO STAND STRL (DRAPES) ×3 IMPLANT
COVER TABLE BACK 60X90 (DRAPES) ×3 IMPLANT
CUFF TOURNIQUET SINGLE 18IN (TOURNIQUET CUFF) ×3 IMPLANT
DECANTER SPIKE VIAL GLASS SM (MISCELLANEOUS) IMPLANT
DRAPE EXTREMITY T 121X128X90 (DRAPE) ×3 IMPLANT
DRAPE SURG 17X23 STRL (DRAPES) ×3 IMPLANT
GAUZE SPONGE 4X4 12PLY STRL LF (GAUZE/BANDAGES/DRESSINGS) ×4 IMPLANT
GAUZE XEROFORM 1X8 LF (GAUZE/BANDAGES/DRESSINGS) ×2 IMPLANT
GLOVE BIO SURGEON STRL SZ 6.5 (GLOVE) ×1 IMPLANT
GLOVE BIOGEL M STRL SZ7.5 (GLOVE) ×3 IMPLANT
GLOVE ORTHO TXT STRL SZ7.5 (GLOVE) ×3 IMPLANT
GLOVE SURG ORTHO 7.0 STRL STRW (GLOVE) ×1 IMPLANT
GOWN PREVENTION PLUS XLARGE (GOWN DISPOSABLE) ×3 IMPLANT
GOWN STRL REIN XL XLG (GOWN DISPOSABLE) ×6 IMPLANT
NEEDLE 27GAX1X1/2 (NEEDLE) ×2 IMPLANT
PACK BASIN DAY SURGERY FS (CUSTOM PROCEDURE TRAY) ×3 IMPLANT
PAD ALCOHOL SWAB (MISCELLANEOUS) ×3 IMPLANT
PAD CAST 4YDX4 CTTN HI CHSV (CAST SUPPLIES) ×2 IMPLANT
PADDING CAST COTTON 4X4 STRL (CAST SUPPLIES)
SPONGE GAUZE 4X4 12PLY (GAUZE/BANDAGES/DRESSINGS) ×2 IMPLANT
STOCKINETTE 4X48 STRL (DRAPES) ×3 IMPLANT
STRIP CLOSURE SKIN 1/2X4 (GAUZE/BANDAGES/DRESSINGS) ×1 IMPLANT
SUT PROLENE 4 0 P 3 18 (SUTURE) ×1 IMPLANT
SWABSTICK POVIDONE IODINE SNGL (MISCELLANEOUS) ×6 IMPLANT
SYR 3ML LL SCALE MARK (SYRINGE) ×3 IMPLANT
SYR CONTROL 10ML LL (SYRINGE) ×1 IMPLANT
TOWEL OR 17X24 6PK STRL BLUE (TOWEL DISPOSABLE) ×3 IMPLANT
TRAY DSU PREP LF (CUSTOM PROCEDURE TRAY) ×1 IMPLANT
UNDERPAD 30X30 INCONTINENT (UNDERPADS AND DIAPERS) ×3 IMPLANT
WATER STERILE IRR 1000ML POUR (IV SOLUTION) IMPLANT

## 2012-07-12 NOTE — Anesthesia Preprocedure Evaluation (Addendum)
Anesthesia Evaluation  Patient identified by MRN, date of birth, ID band Patient awake    Reviewed: Allergy & Precautions, H&P , NPO status , Patient's Chart, lab work & pertinent test results  History of Anesthesia Complications Negative for: history of anesthetic complications  Airway Mallampati: I TM Distance: >3 FB Neck ROM: Full    Dental  (+) Upper Dentures, Missing and Dental Advisory Given   Pulmonary neg pulmonary ROS,  breath sounds clear to auscultation  Pulmonary exam normal       Cardiovascular hypertension, Rhythm:Regular Rate:Normal     Neuro/Psych negative neurological ROS  negative psych ROS   GI/Hepatic GERD-  Medicated,  Endo/Other  diabetes, Oral Hypoglycemic AgentsHypothyroidism   Renal/GU negative Renal ROS     Musculoskeletal   Abdominal   Peds  Hematology   Anesthesia Other Findings   Reproductive/Obstetrics                           Anesthesia Physical Anesthesia Plan  ASA: III  Anesthesia Plan: MAC and General   Post-op Pain Management:    Induction: Intravenous  Airway Management Planned: LMA  Additional Equipment:   Intra-op Plan:   Post-operative Plan: Extubation in OR  Informed Consent: I have reviewed the patients History and Physical, chart, labs and discussed the procedure including the risks, benefits and alternatives for the proposed anesthesia with the patient or authorized representative who has indicated his/her understanding and acceptance.   Dental advisory given  Plan Discussed with: CRNA, Anesthesiologist and Surgeon  Anesthesia Plan Comments:        Anesthesia Quick Evaluation

## 2012-07-12 NOTE — Op Note (Signed)
NAMEANACLARA, BUFKIN              ACCOUNT NO.:  [id number]  MEDICAL RECORD NO.:  [id number]  LOCATION:                                 FACILITY:  PHYSICIAN:  Katy Fitch. Mcgwire Dasaro, M.D. DATE OF BIRTH:  May 14, 1932  DATE OF PROCEDURE:  07/12/2012 DATE OF DISCHARGE:                              OPERATIVE REPORT   PREOPERATIVE DIAGNOSES:  Chronic stenosing tenosynovitis right long finger with PIP flexion contracture and chronic degenerative arthritis of right thumb carpometacarpal joint.  POSTOPERATIVE DIAGNOSES:  Chronic stenosing tenosynovitis right long finger with PIP flexion contracture and chronic degenerative arthritis of right thumb carpometacarpal joint.  OPERATION: 1. Release of right long finger A1 pulley and debridement of flexor     digitorum superficialis tendon. 2. Injection of Depo-Medrol and lidocaine into right thumb     carpometacarpal joint.  OPERATING SURGEON:  Katy Fitch. Haron Beilke, MD  ASSISTANT:  Marveen Reeks Dasnoit, PA-C  ANESTHESIA:  A 2% lidocaine local block of right long finger supplemented by IV sedation.  SUPERVISING ANESTHESIOLOGIST:  Quita Skye. Krista Blue, MD  INDICATIONS:  Ellyse Faille is a 78 year old woman with type 2 diabetes.  She has had chronic arthritis of her thumb, carpometacarpal joints and trigger finger.  She has failed nonoperative measures and now requests release of her right long finger A1 pulley.  Due to her chronic pain at the right thumb CMC joint, she requested that this be injected while she was sedated.  After informed consent, she is brought to the operating room at this time.  DESCRIPTION OF PROCEDURE:  Taran Roeper was brought to room 2 of the [facility] and placed in supine position on the operating table.  Following the induction of IV sedation under Dr. Robina Ade direct supervision, the right hand and arm were prepped with Betadine soap and solution and sterilely draped.  Following routine surgical time-out,  the thumb was placed under traction and a mixture of 1 mL of Depo-Medrol 40 mg per mL and 1 mL of 2% plain lidocaine was then injected into the thumb carpometacarpal joint from a palmar approach.  Good joint distention was achieved.  The arm and hand were then exsanguinated with Esmarch bandage and arterial tourniquet on proximal brachium inflated to 220 mmHg.  The procedure commenced with a short oblique incision in the distal palmar crease.  Subcutaneous tissues were carefully divided to release the palmar fascia.  The A1 pulley of the long finger was identified and found to be quite hypertrophic and fibrotic.  This was split longitudinally with scalpel scissors followed by release of the small A0 pulley proximally.  The tendons were delivered and an area of a partial necrosis of the superficialis tendon was debrided with a micro rongeur.  Thereafter, full passive range of motion of the IP joints of the long finger was identified.  The wound was then repaired with intradermal 4-0 Prolene suture.  A compressive dressing was applied with Steri-Strips, sterile gauze, and Ace wrap.  There were no apparent complications.  For aftercare, Ms. Abril is provided prescription for tramadol 50 mg 1 p.o. q.4 hours p.r.n. pain, 20 tablets without refill.     Katy Fitch Joushua Dugar, M.D.  RVS/MEDQ  D:  07/12/2012  T:  07/12/2012  Job:  [id number]  cc:   Gwen Pounds, MD

## 2012-07-12 NOTE — Op Note (Signed)
[id number] 

## 2012-07-12 NOTE — Anesthesia Postprocedure Evaluation (Signed)
Anesthesia Post Note  Patient: Gabriela Jefferson  Procedure(s) Performed: Procedure(s) (LRB): RELEASE TRIGGER FINGER/A-1 PULLEY (Right) STEROID INJECTION (Right)  Anesthesia type: MAC  Patient location: PACU  Post pain: Pain level controlled  Post assessment: Patient's Cardiovascular Status Stable  Last Vitals:  Filed Vitals:   07/12/12 0845  BP: 111/39  Pulse: 61  Temp:   Resp: 17    Post vital signs: Reviewed and stable  Level of consciousness: sedated  Complications: No apparent anesthesia complications

## 2012-07-12 NOTE — Brief Op Note (Signed)
07/12/2012  8:02 AM  PATIENT:  Franklyn Lor  78 y.o. female  PRE-OPERATIVE DIAGNOSIS:  trigger finger right long, cmc djd  POST-OPERATIVE DIAGNOSIS:  Trigger finger right long and right thumb CMC arthritis  PROCEDURE:  Procedure(s) (LRB) with comments: RELEASE TRIGGER FINGER/A-1 PULLEY (Right) - right long  STEROID INJECTION (Right) - right thumbcarpal-metacarpal joint  SURGEON:  Surgeon(s) and Role:    * Wyn Forster., MD - Primary  PHYSICIAN ASSISTANT:   ASSISTANTS: Mallory Shirk.A-C   ANESTHESIA:   MAC  EBL:  Total I/O In: 500 [I.V.:500] Out: -   BLOOD ADMINISTERED:none  DRAINS: none   LOCAL MEDICATIONS USED:  XYLOCAINE   SPECIMEN:  No Specimen  DISPOSITION OF SPECIMEN:  N/A  COUNTS:  YES  TOURNIQUET:   Total Tourniquet Time Documented: Upper Arm (Right) - 7 minutes  DICTATION: .Other Dictation: Dictation Number [id number]  PLAN OF CARE: Discharge to home after PACU  PATIENT DISPOSITION:  PACU - hemodynamically stable.

## 2012-07-12 NOTE — Anesthesia Procedure Notes (Signed)
Procedure Name: MAC Date/Time: 07/12/2012 7:49 AM Performed by: Caren Macadam Pre-anesthesia Checklist: Patient identified, Emergency Drugs available, Suction available and Patient being monitored Patient Re-evaluated:Patient Re-evaluated prior to inductionOxygen Delivery Method: Simple face mask Preoxygenation: Pre-oxygenation with 100% oxygen Intubation Type: IV induction

## 2012-07-12 NOTE — Transfer of Care (Signed)
Immediate Anesthesia Transfer of Care Note  Patient: Gabriela Jefferson  Procedure(s) Performed: Procedure(s) (LRB) with comments: RELEASE TRIGGER FINGER/A-1 PULLEY (Right) - right long  STEROID INJECTION (Right) - right thumbcarpal-metacarpal joint  Patient Location: PACU  Anesthesia Type: MAC  Level of Consciousness: awake and alert   Airway & Oxygen Therapy: Patient Spontanous Breathing and Patient connected to face mask oxygen  Post-op Assessment: Report given to PACU RN and Post -op Vital signs reviewed and stable  Post vital signs: Reviewed and stable  Complications: No apparent anesthesia complications

## 2012-07-13 ENCOUNTER — Encounter (HOSPITAL_BASED_OUTPATIENT_CLINIC_OR_DEPARTMENT_OTHER): Payer: Self-pay | Admitting: Orthopedic Surgery

## 2012-08-01 DIAGNOSIS — M6530 Trigger finger, unspecified finger: Secondary | ICD-10-CM | POA: Diagnosis not present

## 2012-08-01 DIAGNOSIS — M19049 Primary osteoarthritis, unspecified hand: Secondary | ICD-10-CM | POA: Diagnosis not present

## 2012-08-03 DIAGNOSIS — M19049 Primary osteoarthritis, unspecified hand: Secondary | ICD-10-CM | POA: Diagnosis not present

## 2012-08-03 DIAGNOSIS — M6530 Trigger finger, unspecified finger: Secondary | ICD-10-CM | POA: Diagnosis not present

## 2012-08-09 DIAGNOSIS — M6530 Trigger finger, unspecified finger: Secondary | ICD-10-CM | POA: Diagnosis not present

## 2012-08-09 DIAGNOSIS — M19049 Primary osteoarthritis, unspecified hand: Secondary | ICD-10-CM | POA: Diagnosis not present

## 2012-08-11 DIAGNOSIS — M6530 Trigger finger, unspecified finger: Secondary | ICD-10-CM | POA: Diagnosis not present

## 2012-08-11 DIAGNOSIS — M19049 Primary osteoarthritis, unspecified hand: Secondary | ICD-10-CM | POA: Diagnosis not present

## 2012-08-16 DIAGNOSIS — M19049 Primary osteoarthritis, unspecified hand: Secondary | ICD-10-CM | POA: Diagnosis not present

## 2012-08-16 DIAGNOSIS — M6530 Trigger finger, unspecified finger: Secondary | ICD-10-CM | POA: Diagnosis not present

## 2012-08-18 DIAGNOSIS — M6530 Trigger finger, unspecified finger: Secondary | ICD-10-CM | POA: Diagnosis not present

## 2012-08-18 DIAGNOSIS — M19049 Primary osteoarthritis, unspecified hand: Secondary | ICD-10-CM | POA: Diagnosis not present

## 2012-08-22 DIAGNOSIS — M6530 Trigger finger, unspecified finger: Secondary | ICD-10-CM | POA: Diagnosis not present

## 2012-08-22 DIAGNOSIS — M19049 Primary osteoarthritis, unspecified hand: Secondary | ICD-10-CM | POA: Diagnosis not present

## 2012-08-24 DIAGNOSIS — M6530 Trigger finger, unspecified finger: Secondary | ICD-10-CM | POA: Diagnosis not present

## 2012-08-24 DIAGNOSIS — M19049 Primary osteoarthritis, unspecified hand: Secondary | ICD-10-CM | POA: Diagnosis not present

## 2012-09-09 DIAGNOSIS — E119 Type 2 diabetes mellitus without complications: Secondary | ICD-10-CM | POA: Diagnosis not present

## 2012-09-09 DIAGNOSIS — L608 Other nail disorders: Secondary | ICD-10-CM | POA: Diagnosis not present

## 2012-10-05 DIAGNOSIS — M779 Enthesopathy, unspecified: Secondary | ICD-10-CM | POA: Diagnosis not present

## 2012-11-22 DIAGNOSIS — L608 Other nail disorders: Secondary | ICD-10-CM | POA: Diagnosis not present

## 2012-11-22 DIAGNOSIS — E119 Type 2 diabetes mellitus without complications: Secondary | ICD-10-CM | POA: Diagnosis not present

## 2012-11-22 DIAGNOSIS — L84 Corns and callosities: Secondary | ICD-10-CM | POA: Diagnosis not present

## 2012-11-25 DIAGNOSIS — M25519 Pain in unspecified shoulder: Secondary | ICD-10-CM | POA: Diagnosis not present

## 2012-11-29 DIAGNOSIS — S43429A Sprain of unspecified rotator cuff capsule, initial encounter: Secondary | ICD-10-CM | POA: Diagnosis not present

## 2012-12-27 DIAGNOSIS — M779 Enthesopathy, unspecified: Secondary | ICD-10-CM | POA: Diagnosis not present

## 2013-01-02 DIAGNOSIS — E039 Hypothyroidism, unspecified: Secondary | ICD-10-CM | POA: Diagnosis not present

## 2013-01-02 DIAGNOSIS — E1149 Type 2 diabetes mellitus with other diabetic neurological complication: Secondary | ICD-10-CM | POA: Diagnosis not present

## 2013-01-02 DIAGNOSIS — E538 Deficiency of other specified B group vitamins: Secondary | ICD-10-CM | POA: Diagnosis not present

## 2013-01-02 DIAGNOSIS — R82998 Other abnormal findings in urine: Secondary | ICD-10-CM | POA: Diagnosis not present

## 2013-01-02 DIAGNOSIS — E785 Hyperlipidemia, unspecified: Secondary | ICD-10-CM | POA: Diagnosis not present

## 2013-01-02 DIAGNOSIS — E559 Vitamin D deficiency, unspecified: Secondary | ICD-10-CM | POA: Diagnosis not present

## 2013-01-09 DIAGNOSIS — M810 Age-related osteoporosis without current pathological fracture: Secondary | ICD-10-CM | POA: Diagnosis not present

## 2013-01-09 DIAGNOSIS — G4700 Insomnia, unspecified: Secondary | ICD-10-CM | POA: Diagnosis not present

## 2013-01-09 DIAGNOSIS — E559 Vitamin D deficiency, unspecified: Secondary | ICD-10-CM | POA: Diagnosis not present

## 2013-01-09 DIAGNOSIS — Z1212 Encounter for screening for malignant neoplasm of rectum: Secondary | ICD-10-CM | POA: Diagnosis not present

## 2013-01-09 DIAGNOSIS — Z0000 Encounter for general adult medical examination without abnormal findings: Secondary | ICD-10-CM | POA: Diagnosis not present

## 2013-01-09 DIAGNOSIS — M25519 Pain in unspecified shoulder: Secondary | ICD-10-CM | POA: Diagnosis not present

## 2013-01-09 DIAGNOSIS — K219 Gastro-esophageal reflux disease without esophagitis: Secondary | ICD-10-CM | POA: Diagnosis not present

## 2013-01-09 DIAGNOSIS — M1990 Unspecified osteoarthritis, unspecified site: Secondary | ICD-10-CM | POA: Diagnosis not present

## 2013-01-09 DIAGNOSIS — I10 Essential (primary) hypertension: Secondary | ICD-10-CM | POA: Diagnosis not present

## 2013-01-24 DIAGNOSIS — M6281 Muscle weakness (generalized): Secondary | ICD-10-CM | POA: Diagnosis not present

## 2013-01-24 DIAGNOSIS — R269 Unspecified abnormalities of gait and mobility: Secondary | ICD-10-CM | POA: Diagnosis not present

## 2013-01-24 DIAGNOSIS — E119 Type 2 diabetes mellitus without complications: Secondary | ICD-10-CM | POA: Diagnosis not present

## 2013-01-24 DIAGNOSIS — L608 Other nail disorders: Secondary | ICD-10-CM | POA: Diagnosis not present

## 2013-01-25 DIAGNOSIS — R269 Unspecified abnormalities of gait and mobility: Secondary | ICD-10-CM | POA: Diagnosis not present

## 2013-01-27 DIAGNOSIS — R269 Unspecified abnormalities of gait and mobility: Secondary | ICD-10-CM | POA: Diagnosis not present

## 2013-01-30 DIAGNOSIS — R269 Unspecified abnormalities of gait and mobility: Secondary | ICD-10-CM | POA: Diagnosis not present

## 2013-02-01 DIAGNOSIS — R269 Unspecified abnormalities of gait and mobility: Secondary | ICD-10-CM | POA: Diagnosis not present

## 2013-02-06 DIAGNOSIS — R269 Unspecified abnormalities of gait and mobility: Secondary | ICD-10-CM | POA: Diagnosis not present

## 2013-02-08 DIAGNOSIS — R269 Unspecified abnormalities of gait and mobility: Secondary | ICD-10-CM | POA: Diagnosis not present

## 2013-02-13 DIAGNOSIS — R269 Unspecified abnormalities of gait and mobility: Secondary | ICD-10-CM | POA: Diagnosis not present

## 2013-02-17 DIAGNOSIS — R269 Unspecified abnormalities of gait and mobility: Secondary | ICD-10-CM | POA: Diagnosis not present

## 2013-02-23 DIAGNOSIS — M6281 Muscle weakness (generalized): Secondary | ICD-10-CM | POA: Diagnosis not present

## 2013-02-24 DIAGNOSIS — Z1231 Encounter for screening mammogram for malignant neoplasm of breast: Secondary | ICD-10-CM | POA: Diagnosis not present

## 2013-03-22 DIAGNOSIS — M779 Enthesopathy, unspecified: Secondary | ICD-10-CM | POA: Diagnosis not present

## 2013-03-28 DIAGNOSIS — E119 Type 2 diabetes mellitus without complications: Secondary | ICD-10-CM | POA: Diagnosis not present

## 2013-03-28 DIAGNOSIS — L84 Corns and callosities: Secondary | ICD-10-CM | POA: Diagnosis not present

## 2013-03-28 DIAGNOSIS — L608 Other nail disorders: Secondary | ICD-10-CM | POA: Diagnosis not present

## 2013-05-22 DIAGNOSIS — H2510 Age-related nuclear cataract, unspecified eye: Secondary | ICD-10-CM | POA: Diagnosis not present

## 2013-05-22 DIAGNOSIS — E119 Type 2 diabetes mellitus without complications: Secondary | ICD-10-CM | POA: Diagnosis not present

## 2013-05-24 DIAGNOSIS — M6740 Ganglion, unspecified site: Secondary | ICD-10-CM | POA: Diagnosis not present

## 2013-05-30 ENCOUNTER — Other Ambulatory Visit: Payer: Self-pay | Admitting: Orthopedic Surgery

## 2013-06-07 ENCOUNTER — Other Ambulatory Visit: Payer: Self-pay

## 2013-06-07 ENCOUNTER — Encounter (HOSPITAL_BASED_OUTPATIENT_CLINIC_OR_DEPARTMENT_OTHER)
Admission: RE | Admit: 2013-06-07 | Discharge: 2013-06-07 | Disposition: A | Discharge status: Home / Self Care | Payer: Medicare Other | Source: Ambulatory Visit | Attending: Orthopedic Surgery | Admitting: Orthopedic Surgery

## 2013-06-07 ENCOUNTER — Encounter (HOSPITAL_BASED_OUTPATIENT_CLINIC_OR_DEPARTMENT_OTHER): Payer: Self-pay | Admitting: *Deleted

## 2013-06-07 DIAGNOSIS — Z01812 Encounter for preprocedural laboratory examination: Secondary | ICD-10-CM | POA: Insufficient documentation

## 2013-06-07 DIAGNOSIS — Z01810 Encounter for preprocedural cardiovascular examination: Secondary | ICD-10-CM | POA: Insufficient documentation

## 2013-06-07 DIAGNOSIS — Z01818 Encounter for other preprocedural examination: Secondary | ICD-10-CM | POA: Diagnosis not present

## 2013-06-07 LAB — BASIC METABOLIC PANEL
BUN: 20 mg/dL (ref 6–23)
CO2: 28 mEq/L (ref 19–32)
Chloride: 102 mEq/L (ref 96–112)
GFR calc non Af Amer: 56 mL/min — ABNORMAL LOW (ref >90)
Glucose, Bld: 129 mg/dL — ABNORMAL HIGH (ref 70–99)
Potassium: 4.1 mEq/L (ref 3.5–5.1)
Sodium: 140 mEq/L (ref 135–145)

## 2013-06-07 NOTE — Progress Notes (Signed)
Has been here a few times for hand surgeries-[id number] done

## 2013-06-08 DIAGNOSIS — L84 Corns and callosities: Secondary | ICD-10-CM | POA: Diagnosis not present

## 2013-06-08 DIAGNOSIS — E119 Type 2 diabetes mellitus without complications: Secondary | ICD-10-CM | POA: Diagnosis not present

## 2013-06-08 DIAGNOSIS — L608 Other nail disorders: Secondary | ICD-10-CM | POA: Diagnosis not present

## 2013-06-12 NOTE — H&P (Signed)
  Gabriela Jefferson is an 79 y.o. female.   Chief Complaint: c/o a small cystic mass on the ulnar aspect of her proximal phalanx right long finger  HPI:79 y/o female who is S/P release A-1 pulley right long finger who presented c/o a small cystic mass of the same digit at the A-2 pulley level for the past several months. No h/o trauma. The mass is causing some pain especially with direct pressure.  Past Medical History  Diagnosis Date  . Hypertension   . Diabetes mellitus   . Arthritis   . Osteoporosis   . GERD (gastroesophageal reflux disease)   . Hypothyroidism   . Wears glasses   . Wears dentures     upper    Past Surgical History  Procedure Laterality Date  . Shoulder arthroscopy  2008    left  . Trigger finger release  2005    lt ring  . Cystocele repair    . Rectocele repair    . Abdominal hysterectomy    . Correction hammer toe      left foot  . Tubal ligation    . Colonoscopy    . Trigger finger release  07/12/2012    Procedure: RELEASE TRIGGER FINGER/A-1 PULLEY;  Surgeon: Wyn Forster., MD;  Location: [facility];  Service: Orthopedics;  Laterality: Right;  right long   . Steriod injection  07/12/2012    Procedure: STEROID INJECTION;  Surgeon: Wyn Forster., MD;  Location: [facility];  Service: Orthopedics;  Laterality: Right;  right thumbcarpal-metacarpal joint    History reviewed. No pertinent family history. Social History:  reports that she has never smoked. She does not have any smokeless tobacco history on file. She reports that she does not drink alcohol or use illicit drugs.  Allergies: No Known Allergies  No prescriptions prior to admission    No results found for this or any previous visit (from the past 48 hour(s)).  No results found.   Pertinent items are noted in HPI.  Height 5\' 7"  (1.702 m), weight 86.183 kg (190 lb).  General appearance: alert Head: Normocephalic, without obvious abnormality Neck:  supple, symmetrical, trachea midline Resp: clear to auscultation bilaterally Cardio: regular rate and rhythm GI: normal findings: bowel sounds normal Extremities:Exam reveals a small cystic mass on the ulnar aspect of her proximal phalanx just at the level of the web space adjacent to the A-2 pulley deep to the neurovascular bundle on the ulnar aspect of the finger. There appears to be a small retinacular cyst forming. It is not mobile.  Pulses: 2+ and symmetric Skin: normal Neurologic: Grossly normal   Assessment/Plan Impression: Mass right long finger proximal phalanx A-2 pulley level  Plan: To the OR for excision mass right long finger P-1 segment.The procedure, risks,benefits and post-op course were discussed with the patient at length and they were in agreement with the plan.  DASNOIT,Tamirah George J 06/12/2013, 1:36 PM   H&P documentation: 06/13/2013  -History and Physical Reviewed  -Patient has been re-examined  -No change in the plan of care  Wyn Forster, MD

## 2013-06-13 ENCOUNTER — Ambulatory Visit (HOSPITAL_BASED_OUTPATIENT_CLINIC_OR_DEPARTMENT_OTHER): Payer: Medicare Other | Admitting: Anesthesiology

## 2013-06-13 ENCOUNTER — Encounter (HOSPITAL_BASED_OUTPATIENT_CLINIC_OR_DEPARTMENT_OTHER): Payer: Self-pay | Admitting: Anesthesiology

## 2013-06-13 ENCOUNTER — Ambulatory Visit (HOSPITAL_BASED_OUTPATIENT_CLINIC_OR_DEPARTMENT_OTHER)
Admission: RE | Admit: 2013-06-13 | Discharge: 2013-06-13 | Disposition: A | Discharge status: Home / Self Care | Payer: Medicare Other | Source: Ambulatory Visit | Attending: Orthopedic Surgery | Admitting: Orthopedic Surgery

## 2013-06-13 ENCOUNTER — Encounter (HOSPITAL_BASED_OUTPATIENT_CLINIC_OR_DEPARTMENT_OTHER)
Admission: RE | Disposition: A | Discharge status: Home / Self Care | Payer: Self-pay | Source: Ambulatory Visit | Attending: Orthopedic Surgery

## 2013-06-13 DIAGNOSIS — I10 Essential (primary) hypertension: Secondary | ICD-10-CM | POA: Insufficient documentation

## 2013-06-13 DIAGNOSIS — M6740 Ganglion, unspecified site: Secondary | ICD-10-CM | POA: Diagnosis not present

## 2013-06-13 DIAGNOSIS — E119 Type 2 diabetes mellitus without complications: Secondary | ICD-10-CM | POA: Diagnosis not present

## 2013-06-13 HISTORY — PX: MASS EXCISION: SHX2000

## 2013-06-13 LAB — POCT HEMOGLOBIN-HEMACUE: Hemoglobin: 9.7 g/dL — ABNORMAL LOW (ref 12.0–15.0)

## 2013-06-13 SURGERY — EXCISION MASS
Anesthesia: Monitor Anesthesia Care | Site: Finger | Laterality: Right | Wound class: Clean

## 2013-06-13 MED ORDER — LIDOCAINE HCL (CARDIAC) 20 MG/ML IV SOLN
INTRAVENOUS | Status: DC | PRN
  Administered 2013-06-13: 30 mg via INTRAVENOUS

## 2013-06-13 MED ORDER — FENTANYL CITRATE 0.05 MG/ML IJ SOLN: 50.0000 ug | INTRAMUSCULAR | Status: DC | PRN

## 2013-06-13 MED ORDER — FENTANYL CITRATE 0.05 MG/ML IJ SOLN: 25.0000 ug | INTRAMUSCULAR | Status: DC | PRN

## 2013-06-13 MED ORDER — FENTANYL CITRATE 0.05 MG/ML IJ SOLN
INTRAMUSCULAR | Status: DC | PRN
  Administered 2013-06-13: 50 ug via INTRAVENOUS

## 2013-06-13 MED ORDER — PROPOFOL 10 MG/ML IV BOLUS
INTRAVENOUS | Status: DC | PRN
  Administered 2013-06-13: 40 mg via INTRAVENOUS

## 2013-06-13 MED ORDER — MIDAZOLAM HCL 5 MG/5ML IJ SOLN
INTRAMUSCULAR | Status: DC | PRN
  Administered 2013-06-13: 1 mg via INTRAVENOUS

## 2013-06-13 MED ORDER — METOCLOPRAMIDE HCL 5 MG/ML IJ SOLN: 10.0000 mg | Freq: Once | INTRAMUSCULAR | Status: DC | PRN

## 2013-06-13 MED ORDER — PROPOFOL 10 MG/ML IV EMUL
INTRAVENOUS | Status: DC | PRN
  Administered 2013-06-13: 100 ug/kg/min via INTRAVENOUS

## 2013-06-13 MED ORDER — CHLORHEXIDINE GLUCONATE 4 % EX LIQD: 60.0000 mL | Freq: Once | CUTANEOUS | Status: DC

## 2013-06-13 MED ORDER — OXYCODONE HCL 5 MG PO TABS: 5.0000 mg | ORAL_TABLET | Freq: Once | ORAL | Status: DC | PRN

## 2013-06-13 MED ORDER — LIDOCAINE HCL 2 % IJ SOLN
INTRAMUSCULAR | Status: DC | PRN
  Administered 2013-06-13: 3 mL

## 2013-06-13 MED ORDER — MIDAZOLAM HCL 2 MG/2ML IJ SOLN: 1.0000 mg | INTRAMUSCULAR | Status: DC | PRN

## 2013-06-13 MED ORDER — LACTATED RINGERS IV SOLN
INTRAVENOUS | Status: DC
  Administered 2013-06-13 (×2): via INTRAVENOUS

## 2013-06-13 MED ORDER — OXYCODONE-ACETAMINOPHEN 5-325 MG PO TABS: ORAL_TABLET | ORAL | Status: DC

## 2013-06-13 MED ORDER — OXYCODONE HCL 5 MG/5ML PO SOLN: 5.0000 mg | Freq: Once | ORAL | Status: DC | PRN

## 2013-06-13 SURGICAL SUPPLY — 55 items
BANDAGE ADHESIVE 1X3 (GAUZE/BANDAGES/DRESSINGS) IMPLANT
BANDAGE ELASTIC 3 VELCRO ST LF (GAUZE/BANDAGES/DRESSINGS) IMPLANT
BANDAGE GAUZE ELAST BULKY 4 IN (GAUZE/BANDAGES/DRESSINGS) IMPLANT
BLADE MINI RND TIP GREEN BEAV (BLADE) IMPLANT
BLADE SURG 15 STRL LF DISP TIS (BLADE) ×1 IMPLANT
BLADE SURG 15 STRL SS (BLADE) ×2
BNDG CMPR 9X4 STRL LF SNTH (GAUZE/BANDAGES/DRESSINGS) ×1
BNDG CMPR MD 5X2 ELC HKLP STRL (GAUZE/BANDAGES/DRESSINGS)
BNDG COHESIVE 1X5 TAN STRL LF (GAUZE/BANDAGES/DRESSINGS) ×1 IMPLANT
BNDG ELASTIC 2 VLCR STRL LF (GAUZE/BANDAGES/DRESSINGS) IMPLANT
BNDG ESMARK 4X9 LF (GAUZE/BANDAGES/DRESSINGS) ×1 IMPLANT
BRUSH SCRUB EZ PLAIN DRY (MISCELLANEOUS) ×2 IMPLANT
CLOTH BEACON ORANGE TIMEOUT ST (SAFETY) ×2 IMPLANT
CORDS BIPOLAR (ELECTRODE) ×1 IMPLANT
COVER MAYO STAND STRL (DRAPES) ×2 IMPLANT
COVER TABLE BACK 60X90 (DRAPES) ×2 IMPLANT
CUFF TOURNIQUET SINGLE 18IN (TOURNIQUET CUFF) ×1 IMPLANT
DECANTER SPIKE VIAL GLASS SM (MISCELLANEOUS) IMPLANT
DRAIN PENROSE 1/2X12 LTX STRL (WOUND CARE) IMPLANT
DRAIN PENROSE 1/4X12 LTX STRL (WOUND CARE) IMPLANT
DRAPE EXTREMITY T 121X128X90 (DRAPE) ×2 IMPLANT
DRAPE SURG 17X23 STRL (DRAPES) ×2 IMPLANT
GAUZE XEROFORM 1X8 LF (GAUZE/BANDAGES/DRESSINGS) IMPLANT
GLOVE BIOGEL M STRL SZ7.5 (GLOVE) ×2 IMPLANT
GLOVE BIOGEL PI IND STRL 7.0 (GLOVE) IMPLANT
GLOVE BIOGEL PI INDICATOR 7.0 (GLOVE) ×1
GLOVE ECLIPSE 6.5 STRL STRAW (GLOVE) ×1 IMPLANT
GLOVE EXAM NITRILE LRG STRL (GLOVE) ×1 IMPLANT
GLOVE ORTHO TXT STRL SZ7.5 (GLOVE) ×2 IMPLANT
GOWN BRE IMP PREV XXLGXLNG (GOWN DISPOSABLE) ×3 IMPLANT
GOWN PREVENTION PLUS XLARGE (GOWN DISPOSABLE) ×2 IMPLANT
NDL BLUNT 17GA (NEEDLE) IMPLANT
NDL SAFETY ECLIPSE 18X1.5 (NEEDLE) IMPLANT
NEEDLE 27GAX1X1/2 (NEEDLE) ×1 IMPLANT
NEEDLE BLUNT 17GA (NEEDLE) IMPLANT
NEEDLE HYPO 18GX1.5 SHARP (NEEDLE) ×2
NS IRRIG 1000ML POUR BTL (IV SOLUTION) ×1 IMPLANT
PACK BASIN DAY SURGERY FS (CUSTOM PROCEDURE TRAY) ×2 IMPLANT
PADDING CAST ABS 4INX4YD NS (CAST SUPPLIES)
PADDING CAST ABS COTTON 4X4 ST (CAST SUPPLIES) ×1 IMPLANT
PADDING UNDERCAST 2  STERILE (CAST SUPPLIES) IMPLANT
SPONGE GAUZE 4X4 12PLY (GAUZE/BANDAGES/DRESSINGS) IMPLANT
STOCKINETTE 4X48 STRL (DRAPES) ×2 IMPLANT
STRIP CLOSURE SKIN 1/2X4 (GAUZE/BANDAGES/DRESSINGS) ×1 IMPLANT
SUT ETHILON 5 0 P 3 18 (SUTURE)
SUT MERSILENE 4 0 P 3 (SUTURE) IMPLANT
SUT NYLON ETHILON 5-0 P-3 1X18 (SUTURE) IMPLANT
SUT PROLENE 3 0 PS 2 (SUTURE) IMPLANT
SUT PROLENE 4 0 P 3 18 (SUTURE) ×1 IMPLANT
SYR 20CC LL (SYRINGE) IMPLANT
SYR 3ML 23GX1 SAFETY (SYRINGE) IMPLANT
SYR CONTROL 10ML LL (SYRINGE) ×1 IMPLANT
TOWEL OR 17X24 6PK STRL BLUE (TOWEL DISPOSABLE) ×3 IMPLANT
TRAY DSU PREP LF (CUSTOM PROCEDURE TRAY) ×2 IMPLANT
UNDERPAD 30X30 INCONTINENT (UNDERPADS AND DIAPERS) ×2 IMPLANT

## 2013-06-13 NOTE — Anesthesia Postprocedure Evaluation (Signed)
Anesthesia Post Note  Patient: Gabriela Jefferson  Procedure(s) Performed: Procedure(s) (LRB): EXCISION MASS RIGHT LONG FINGER (Right)  Anesthesia type: MAC  Patient location: PACU  Post pain: Pain level controlled  Post assessment: Patient's Cardiovascular Status Stable  Last Vitals:  Filed Vitals:   06/13/13 0830  BP: 119/41  Pulse: 59  Temp:   Resp: 16    Post vital signs: Reviewed and stable  Level of consciousness: alert  Complications: No apparent anesthesia complications

## 2013-06-13 NOTE — Op Note (Signed)
[id number] 

## 2013-06-13 NOTE — Anesthesia Preprocedure Evaluation (Signed)
Anesthesia Evaluation  Patient identified by MRN, date of birth, ID band Patient awake    Reviewed: Allergy & Precautions, H&P , NPO status , Patient's Chart, lab work & pertinent test results, reviewed documented beta blocker date and time   Airway Mallampati: II TM Distance: >3 FB Neck ROM: full    Dental   Pulmonary neg pulmonary ROS,  breath sounds clear to auscultation        Cardiovascular hypertension, On Medications Rhythm:regular     Neuro/Psych negative neurological ROS  negative psych ROS   GI/Hepatic Neg liver ROS, GERD-  Medicated and Controlled,  Endo/Other  diabetes, Oral Hypoglycemic AgentsHypothyroidism   Renal/GU negative Renal ROS  negative genitourinary   Musculoskeletal   Abdominal   Peds  Hematology negative hematology ROS (+)   Anesthesia Other Findings See surgeon's H&P   Reproductive/Obstetrics negative OB ROS                           Anesthesia Physical Anesthesia Plan  ASA: III  Anesthesia Plan: MAC   Post-op Pain Management:    Induction: Intravenous  Airway Management Planned: Simple Face Mask  Additional Equipment:   Intra-op Plan:   Post-operative Plan:   Informed Consent: I have reviewed the patients History and Physical, chart, labs and discussed the procedure including the risks, benefits and alternatives for the proposed anesthesia with the patient or authorized representative who has indicated his/her understanding and acceptance.   Dental Advisory Given  Plan Discussed with: CRNA and Surgeon  Anesthesia Plan Comments:         Anesthesia Quick Evaluation

## 2013-06-13 NOTE — Transfer of Care (Signed)
Immediate Anesthesia Transfer of Care Note  Patient: Gabriela Jefferson  Procedure(s) Performed: Procedure(s): EXCISION MASS RIGHT LONG FINGER (Right)  Patient Location: PACU  Anesthesia Type:MAC  Level of Consciousness: awake  Airway & Oxygen Therapy: Patient Spontanous Breathing and Patient connected to nasal cannula oxygen  Post-op Assessment: Report given to PACU RN and Post -op Vital signs reviewed and stable  Post vital signs: stable  Complications: No apparent anesthesia complications

## 2013-06-13 NOTE — Brief Op Note (Signed)
06/13/2013  8:17 AM  PATIENT:  Gabriela Jefferson  79 y.o. female  PRE-OPERATIVE DIAGNOSIS:  CYST RIGHT LONG FINGER  POST-OPERATIVE DIAGNOSIS:  CYST RIGHT LONG FINGER  PROCEDURE:  Procedure(s): EXCISION MASS RIGHT LONG FINGER (Right)  SURGEON:  Surgeon(s) and Role:    * Wyn Forster., MD - Primary  PHYSICIAN ASSISTANT:   ASSISTANTS: surgical technician  ANESTHESIA:   MAC  EBL:  Total I/O In: 500 [I.V.:500] Out: 5 [Blood:5]  BLOOD ADMINISTERED:none  DRAINS: none   LOCAL MEDICATIONS USED:  XYLOCAINE   SPECIMEN:  No Specimen  DISPOSITION OF SPECIMEN:  N/A  COUNTS:  YES  TOURNIQUET:   Total Tourniquet Time Documented: Upper Arm (Right) - 10 minutes Total: Upper Arm (Right) - 10 minutes   DICTATION: .Other Dictation: Dictation Number [id number]  PLAN OF CARE: Admit for overnight observation  PATIENT DISPOSITION:  PACU - hemodynamically stable.   Delay start of Pharmacological VTE agent (>24hrs) due to surgical blood loss or risk of bleeding: not applicable

## 2013-06-13 NOTE — Op Note (Signed)
[name], [name]              ACCOUNT NO.:  [id number]  MEDICAL RECORD NO.:  [id number]  LOCATION:                               FACILITY:  [facility]  PHYSICIAN:  Gabriela Fitch. Daton Szilagyi, M.D. DATE OF BIRTH:  [birth year]  DATE OF PROCEDURE:  06/13/2013 DATE OF DISCHARGE:  06/13/2013                              OPERATIVE REPORT   PREOPERATIVE DIAGNOSES:  Painful mass, ulnar aspect of right long finger proximal phalangeal segment.  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  OPERATION:  Resection of myxoid cyst from the intermetacarpal ligament and periosteum proximal phalanx, right long finger.  OPERATING SURGEON:  Gabriela Fitch. Sarabella Caprio, MD.  ASSISTANT:  Surgical technician.  ANESTHESIA:  Lidocaine 2%, metacarpal head level block of right long finger supplemented by IV sedation.  SUPERVISING ANESTHESIOLOGIST:  Dr. Gelene Mink.  INDICATIONS:  Gabriela Jefferson is a 79 year old woman well acquainted with our practice.  She has a history of diabetes.  She presented for evaluation of a painful mass along the ulnar aspect of her right long finger, proximal phalanx in the web.  On exam, this had the feel of a probable myxoid cyst.  X-rays were nondiagnostic.  We offered observation versus excision.  She requested this be removed.  After an informed consent, she was brought to the operating room at this time.  PROCEDURE IN DETAIL:  Gabriela Jefferson was brought to room 6 of the [facility] and placed in supine position on the operating table.  Under Dr. Thornton Dales supervision, IV sedation was provided.  Following Betadine prep, 2% lidocaine was infiltrated at metacarpal head level around the common digital and proper digital nerves, long finger, followed by flexor sheath injection.  After 5 minutes of excellent anesthesia was achieved.  The right hand and arm were then prepped with Betadine soap and solution, sterilely draped.  A pneumatic  tourniquet was applied to the proximal right brachium.  Following exsanguination of the right arm with an Esmarch bandage, arterial tourniquet was inflated to 220 mmHg.  Following routine surgical time-out, the procedure commenced with a Brunner zigzag incision centered over the proximal finger flexion crease.  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  The flexor sheath was identified and the Ragnell retractors were used to retract the ulnar, proper digital nerve and artery.  A purplish hemorrhagic myxoid cyst was identified at the junction of the periosteum and the intermetacarpal ligament.  This was quite tense.  This was circumferentially dissected with scissors and removed piecemeal with a rongeur.  A small sinus tract was noted towards the flexor sheath that was widened and excised with a rongeur.  Bleeding points were electrocauterized bipolar current followed by repair of the skin with intradermal 4-0 Prolene suture.  A compressive dressing was applied with Steri-Strips, sterile gauze, and Coban.  For aftercare, Gabriela Jefferson is provided a prescription for Percocet 5 mg 1 p.o. every 4-6 hours p.r.n. pain.  She is encouraged to use ibuprofen or Aleve as needed.     Gabriela Jefferson, M.D.     RVS/MEDQ  D:  06/13/2013  T:  06/13/2013  Job:  [id number]

## 2013-06-14 ENCOUNTER — Encounter (HOSPITAL_BASED_OUTPATIENT_CLINIC_OR_DEPARTMENT_OTHER): Payer: Self-pay | Admitting: Orthopedic Surgery

## 2013-07-04 DIAGNOSIS — M6740 Ganglion, unspecified site: Secondary | ICD-10-CM | POA: Diagnosis not present

## 2013-07-05 DIAGNOSIS — M6740 Ganglion, unspecified site: Secondary | ICD-10-CM | POA: Diagnosis not present

## 2013-07-10 DIAGNOSIS — Z23 Encounter for immunization: Secondary | ICD-10-CM | POA: Diagnosis not present

## 2013-07-10 DIAGNOSIS — E039 Hypothyroidism, unspecified: Secondary | ICD-10-CM | POA: Diagnosis not present

## 2013-07-10 DIAGNOSIS — E538 Deficiency of other specified B group vitamins: Secondary | ICD-10-CM | POA: Diagnosis not present

## 2013-07-10 DIAGNOSIS — E785 Hyperlipidemia, unspecified: Secondary | ICD-10-CM | POA: Diagnosis not present

## 2013-07-10 DIAGNOSIS — I10 Essential (primary) hypertension: Secondary | ICD-10-CM | POA: Diagnosis not present

## 2013-07-10 DIAGNOSIS — K219 Gastro-esophageal reflux disease without esophagitis: Secondary | ICD-10-CM | POA: Diagnosis not present

## 2013-07-10 DIAGNOSIS — E1149 Type 2 diabetes mellitus with other diabetic neurological complication: Secondary | ICD-10-CM | POA: Diagnosis not present

## 2013-07-10 DIAGNOSIS — M25519 Pain in unspecified shoulder: Secondary | ICD-10-CM | POA: Diagnosis not present

## 2013-07-10 DIAGNOSIS — Z1331 Encounter for screening for depression: Secondary | ICD-10-CM | POA: Diagnosis not present

## 2013-07-11 DIAGNOSIS — M6740 Ganglion, unspecified site: Secondary | ICD-10-CM | POA: Diagnosis not present

## 2013-07-14 DIAGNOSIS — M6740 Ganglion, unspecified site: Secondary | ICD-10-CM | POA: Diagnosis not present

## 2013-07-18 DIAGNOSIS — M6740 Ganglion, unspecified site: Secondary | ICD-10-CM | POA: Diagnosis not present

## 2013-07-26 DIAGNOSIS — M779 Enthesopathy, unspecified: Secondary | ICD-10-CM | POA: Diagnosis not present

## 2013-08-15 DIAGNOSIS — L608 Other nail disorders: Secondary | ICD-10-CM | POA: Diagnosis not present

## 2013-08-15 DIAGNOSIS — E119 Type 2 diabetes mellitus without complications: Secondary | ICD-10-CM | POA: Diagnosis not present

## 2013-08-15 DIAGNOSIS — L84 Corns and callosities: Secondary | ICD-10-CM | POA: Diagnosis not present

## 2013-08-18 DIAGNOSIS — Z6830 Body mass index (BMI) 30.0-30.9, adult: Secondary | ICD-10-CM | POA: Diagnosis not present

## 2013-08-18 DIAGNOSIS — M79609 Pain in unspecified limb: Secondary | ICD-10-CM | POA: Diagnosis not present

## 2013-08-18 DIAGNOSIS — M1990 Unspecified osteoarthritis, unspecified site: Secondary | ICD-10-CM | POA: Diagnosis not present

## 2013-08-18 DIAGNOSIS — M5412 Radiculopathy, cervical region: Secondary | ICD-10-CM | POA: Diagnosis not present

## 2013-09-01 ENCOUNTER — Other Ambulatory Visit: Payer: Self-pay | Admitting: Orthopaedic Surgery

## 2013-09-01 DIAGNOSIS — M25559 Pain in unspecified hip: Secondary | ICD-10-CM | POA: Diagnosis not present

## 2013-09-02 DIAGNOSIS — M79609 Pain in unspecified limb: Secondary | ICD-10-CM | POA: Diagnosis not present

## 2013-09-05 ENCOUNTER — Other Ambulatory Visit: Payer: Medicare Other

## 2013-09-05 DIAGNOSIS — M1610 Unilateral primary osteoarthritis, unspecified hip: Secondary | ICD-10-CM | POA: Diagnosis not present

## 2013-09-05 DIAGNOSIS — M25559 Pain in unspecified hip: Secondary | ICD-10-CM | POA: Diagnosis not present

## 2013-09-05 DIAGNOSIS — M169 Osteoarthritis of hip, unspecified: Secondary | ICD-10-CM | POA: Diagnosis not present

## 2013-09-26 ENCOUNTER — Emergency Department (HOSPITAL_COMMUNITY)
Admission: EM | Admit: 2013-09-26 | Discharge: 2013-09-26 | Disposition: A | Discharge status: Home / Self Care | Payer: Medicare Other | Attending: Emergency Medicine | Admitting: Emergency Medicine

## 2013-09-26 ENCOUNTER — Other Ambulatory Visit: Payer: Self-pay

## 2013-09-26 ENCOUNTER — Encounter (HOSPITAL_COMMUNITY): Payer: Self-pay | Admitting: Emergency Medicine

## 2013-09-26 DIAGNOSIS — Z79899 Other long term (current) drug therapy: Secondary | ICD-10-CM | POA: Diagnosis not present

## 2013-09-26 DIAGNOSIS — K219 Gastro-esophageal reflux disease without esophagitis: Secondary | ICD-10-CM | POA: Insufficient documentation

## 2013-09-26 DIAGNOSIS — M810 Age-related osteoporosis without current pathological fracture: Secondary | ICD-10-CM | POA: Insufficient documentation

## 2013-09-26 DIAGNOSIS — R61 Generalized hyperhidrosis: Secondary | ICD-10-CM | POA: Insufficient documentation

## 2013-09-26 DIAGNOSIS — M129 Arthropathy, unspecified: Secondary | ICD-10-CM | POA: Insufficient documentation

## 2013-09-26 DIAGNOSIS — R6889 Other general symptoms and signs: Secondary | ICD-10-CM | POA: Diagnosis not present

## 2013-09-26 DIAGNOSIS — Z789 Other specified health status: Secondary | ICD-10-CM | POA: Insufficient documentation

## 2013-09-26 DIAGNOSIS — Z98811 Dental restoration status: Secondary | ICD-10-CM | POA: Insufficient documentation

## 2013-09-26 DIAGNOSIS — Z885 Allergy status to narcotic agent status: Secondary | ICD-10-CM | POA: Insufficient documentation

## 2013-09-26 DIAGNOSIS — E119 Type 2 diabetes mellitus without complications: Secondary | ICD-10-CM | POA: Diagnosis not present

## 2013-09-26 DIAGNOSIS — R42 Dizziness and giddiness: Secondary | ICD-10-CM | POA: Insufficient documentation

## 2013-09-26 DIAGNOSIS — I10 Essential (primary) hypertension: Secondary | ICD-10-CM | POA: Insufficient documentation

## 2013-09-26 DIAGNOSIS — E039 Hypothyroidism, unspecified: Secondary | ICD-10-CM | POA: Diagnosis not present

## 2013-09-26 LAB — POCT I-STAT, CHEM 8
BUN: 19 mg/dL (ref 6–23)
Calcium, Ion: 1.13 mmol/L (ref 1.13–1.30)
Chloride: 103 mEq/L (ref 96–112)
Creatinine, Ser: 1.3 mg/dL — ABNORMAL HIGH (ref 0.50–1.10)

## 2013-09-26 LAB — CBC
HCT: 36.8 % (ref 36.0–46.0)
Hemoglobin: 12.1 g/dL (ref 12.0–15.0)
MCH: 29.8 pg (ref 26.0–34.0)
MCV: 90.6 fL (ref 78.0–100.0)
RBC: 4.06 MIL/uL (ref 3.87–5.11)

## 2013-09-26 LAB — GLUCOSE, CAPILLARY
Glucose-Capillary: 184 mg/dL — ABNORMAL HIGH (ref 70–99)
Glucose-Capillary: 117 mg/dL — ABNORMAL HIGH (ref 70–99)

## 2013-09-26 NOTE — ED Provider Notes (Signed)
CSN: [id number]     Arrival date & time 09/26/13  1340 History   First MD Initiated Contact with Patient 09/26/13 1351     Chief Complaint  Patient presents with  . Loss of Consciousness   HPI 79 year old woman with PMH DM2, HTN, hypothyroid, GERD, OA who presents for loss of consciousness.   Patient states she was shopping in [place] when all at once she felt like the room was spinning; she has never experienced this sensation before.  She grabbed onto a clothing rack and slowly lowered herself to the floor.  She did not hit her head or lose consciousness.  She did feel somewhat sweaty though denies nausea/vomiting or ringing in her ears.  An employee helped her into a chair and brought her some water.  She also ate a hard candy (though she did not feel like her blood sugar was low) and dizziness resolved.  Lowest recent CBG at home was 83 (fasting) this morning.     Denies fevers, headache, shortness of breath, cough, chest pain, palpitations, abdominal pain, numbness/tingling; no change in bowel or bladder habits including dysuria and frequency.    Past Medical History  Diagnosis Date  . Hypertension   . Diabetes mellitus   . Arthritis   . Osteoporosis   . GERD (gastroesophageal reflux disease)   . Hypothyroidism   . Wears glasses   . Wears dentures     upper   Past Surgical History  Procedure Laterality Date  . Shoulder arthroscopy  2008    left  . Trigger finger release  2005    lt ring  . Cystocele repair    . Rectocele repair    . Abdominal hysterectomy    . Correction hammer toe      left foot  . Tubal ligation    . Colonoscopy    . Trigger finger release  07/12/2012    Procedure: RELEASE TRIGGER FINGER/A-1 PULLEY;  Surgeon: Wyn Forster., MD;  Location: [facility];  Service: Orthopedics;  Laterality: Right;  right long   . Steriod injection  07/12/2012    Procedure: STEROID INJECTION;  Surgeon: Wyn Forster., MD;  Location: [facility];  Service: Orthopedics;  Laterality: Right;  right thumbcarpal-metacarpal joint  . Mass excision Right 06/13/2013    Procedure: EXCISION MASS RIGHT LONG FINGER;  Surgeon: Wyn Forster., MD;  Location:  SUR[facility]ERY [facility];  Service: Orthopedics;  Laterality: Right;   No family history on file. History  Substance Use Topics  . Smoking status: Never Smoker   . Smokeless tobacco: Not on file  . Alcohol Use: No   OB History   Grav Para Term Preterm Abortions TAB SAB Ect Mult Living                 Review of Systems  Constitutional: Negative for fever, activity change, appetite change and fatigue.  HENT: Negative for congestion and trouble swallowing.   Respiratory: Negative for cough and shortness of breath.   Cardiovascular: Negative for chest pain and leg swelling.  Gastrointestinal: Negative for nausea, vomiting, abdominal pain, diarrhea, constipation and blood in stool.  Genitourinary: Negative for dysuria and frequency.  Musculoskeletal: Negative for back pain and neck pain.  Neurological: Positive for dizziness. Negative for seizures, syncope, weakness, light-headedness, numbness and headaches.    Allergies  Oxycodone  Home Medications   Current Outpatient Rx  Name  Route  Sig  Dispense  Refill  . alendronate (FOSAMAX) 70 MG tablet   Oral   Take 70 mg by mouth every 14 (fourteen) days. Take with a full glass of water on an empty stomach.         . cholecalciferol (VITAMIN D) 1000 UNITS tablet   Oral   Take 2,000 Units by mouth daily.         Marland Kitchen glyBURIDE (DIABETA) 5 MG tablet   Oral   Take 5 mg by mouth daily with breakfast.         . irbesartan (AVAPRO) 150 MG tablet   Oral   Take 150 mg by mouth at bedtime.         Marland Kitchen levothyroxine (SYNTHROID, LEVOTHROID) 125 MCG tablet   Oral   Take 125 mcg by mouth daily.         . metFORMIN (GLUCOPHAGE) 500 MG tablet   Oral   Take 500 mg by mouth 2 (two) times daily with a meal. Takes 2  at a time=1000mg          . naproxen (NAPROSYN) 500 MG tablet   Oral   Take 500 mg by mouth daily as needed for moderate pain (arthritis).         . ranitidine (ZANTAC) 300 MG capsule   Oral   Take 300 mg by mouth as needed.          . rosuvastatin (CRESTOR) 20 MG tablet   Oral   Take 20 mg by mouth daily.         . traMADol (ULTRAM) 50 MG tablet      1 tab every 4 hours as needed for pain   20 tablet   0   . triamterene-hydrochlorothiazide (MAXZIDE) 75-50 MG per tablet   Oral   Take 1 tablet by mouth daily.         . vitamin B-12 (CYANOCOBALAMIN) 100 MCG tablet   Oral   Take 50 mcg by mouth daily.          BP 131/40  Pulse 66  Temp(Src) 98.1 F (36.7 C) (Oral)  Resp 17  SpO2 100% Physical Exam  Constitutional: She is oriented to person, place, and time. She appears well-developed and well-nourished. No distress.  HENT:  Head: Normocephalic and atraumatic.  Eyes: Conjunctivae and EOM are normal. Pupils are equal, round, and reactive to light.  Neck: Normal range of motion. Neck supple.  Cardiovascular: Normal rate, regular rhythm and normal heart sounds.   Pulmonary/Chest: Effort normal and breath sounds normal. She has no wheezes. She has no rales.  Abdominal: Soft. Bowel sounds are normal. She exhibits no distension. There is no tenderness.  Musculoskeletal: Normal range of motion.  Neurological: She is alert and oriented to person, place, and time. No cranial nerve deficit.  Strength and sensation intact throughout  Skin: Skin is warm and dry.    ED Course  Procedures (including critical care time) Labs Review Labs Reviewed  GLUCOSE, CAPILLARY - Abnormal; Notable for the following:    Glucose-Capillary 184 (*)    All other components within normal limits  POCT I-STAT, CHEM 8 - Abnormal; Notable for the following:    Creatinine, Ser 1.30 (*)    Glucose, Bld 188 (*)    All other components within normal limits  CBC   Imaging Review No  results found.  EKG Interpretation    Date/Time:  Tuesday September 26 2013 14:01:33 EST Ventricular Rate:  77 PR Interval:  184 QRS Duration: 80  QT Interval:  390 QTC Calculation: 441 R Axis:   40 Text Interpretation:  Sinus rhythm Heart rate 77 No acute changes Confirmed by Rhunette Croft, MD, ANKIT ([id number]) on 09/26/2013 3:16:45 PM           Date: 09/26/2013  Rate: 77  Rhythm: normal sinus rhythm  QRS Axis: normal  Intervals: normal  ST/T Wave abnormalities: normal  Conduction Disturbances:none  Narrative Interpretation:   Old EKG Reviewed: unchanged   MDM  Dizziness- Unclear etiology but differential includes BPPV vs. Hypoglycemia vs. TIA.  ABDC2 score of 2 therefore patient should have outpatient TIA workup. EKG with NSR. BMP within normal limits other than Cr 1.3. CBC within normal limits.  Spoke with patient's PCP Dr. Creola Corn regarding patient's presentation in ED and need for close outpatient follow-up.  She has appointment with him on Friday 12/12 at 11:30a.  Will repeat CBG 2 hours after arrival to confirm not due to sulfonylurea-induced hypoglycemia.   Patient staffed with Dr. Rhunette Croft, discussed and turned over to Dr. Criss Alvine at 3:30p.   Rocco Serene, MD 09/26/13 [phone]

## 2013-09-26 NOTE — ED Notes (Signed)
Discharge and follow up instructions reviewed with pt. Pt verbalized understanding.  

## 2013-09-26 NOTE — ED Provider Notes (Signed)
I saw and evaluated the patient, reviewed the resident's note and I agree with the findings and plan.  EKG Interpretation    Date/Time:  Tuesday September 26 2013 14:01:33 EST Ventricular Rate:  77 PR Interval:  184 QRS Duration: 80 QT Interval:  390 QTC Calculation: 441 R Axis:   40 Text Interpretation:  Sinus rhythm Heart rate 77 No acute changes Confirmed by Viviano Bir, MD, Ladaysha Soutar ([id number]) on 09/26/2013 3:16:45 PM            Pt comes in with cc of vertigo and fall. Sudden onset, lasted for <5 minutes - VERTIGO did, and patient fell. Pt had no near syncope, syncope on my hx. No ear complains. If TIA - which i think is possible, although unlikely - her ABCD2 score is 2 - and we have set up and appt with Dr. Timothy Lasso for possible TIa workup. Pt has no neuro deficits now. Doubt hypoglycemia, but will get serial CBG here to be sure. Return precautions discussed.   Derwood Kaplan, MD 09/26/13 [phone]

## 2013-09-26 NOTE — ED Notes (Signed)
Pt was at the [facility] and she got hot and then fell backwards passing out but did not hit head.  EKG negative.  CBG 170.  168/68.  No pain.

## 2013-09-29 DIAGNOSIS — I951 Orthostatic hypotension: Secondary | ICD-10-CM | POA: Diagnosis not present

## 2013-09-29 DIAGNOSIS — I10 Essential (primary) hypertension: Secondary | ICD-10-CM | POA: Diagnosis not present

## 2013-09-29 DIAGNOSIS — R42 Dizziness and giddiness: Secondary | ICD-10-CM | POA: Diagnosis not present

## 2013-09-29 DIAGNOSIS — Z8669 Personal history of other diseases of the nervous system and sense organs: Secondary | ICD-10-CM | POA: Diagnosis not present

## 2013-09-29 DIAGNOSIS — M79609 Pain in unspecified limb: Secondary | ICD-10-CM | POA: Diagnosis not present

## 2013-09-29 DIAGNOSIS — R55 Syncope and collapse: Secondary | ICD-10-CM | POA: Diagnosis not present

## 2013-10-03 ENCOUNTER — Other Ambulatory Visit (HOSPITAL_COMMUNITY): Payer: Self-pay | Admitting: Internal Medicine

## 2013-10-03 ENCOUNTER — Ambulatory Visit (HOSPITAL_COMMUNITY)
Admission: RE | Admit: 2013-10-03 | Discharge: 2013-10-03 | Disposition: A | Discharge status: Home / Self Care | Payer: Medicare Other | Source: Ambulatory Visit | Attending: Vascular Surgery | Admitting: Vascular Surgery

## 2013-10-03 DIAGNOSIS — R55 Syncope and collapse: Secondary | ICD-10-CM | POA: Insufficient documentation

## 2013-10-17 DIAGNOSIS — L608 Other nail disorders: Secondary | ICD-10-CM | POA: Diagnosis not present

## 2013-10-17 DIAGNOSIS — E119 Type 2 diabetes mellitus without complications: Secondary | ICD-10-CM | POA: Diagnosis not present

## 2013-10-17 DIAGNOSIS — L84 Corns and callosities: Secondary | ICD-10-CM | POA: Diagnosis not present

## 2013-10-18 ENCOUNTER — Ambulatory Visit (HOSPITAL_COMMUNITY): Payer: Medicare Other | Attending: Internal Medicine | Admitting: Cardiology

## 2013-10-18 ENCOUNTER — Other Ambulatory Visit (HOSPITAL_COMMUNITY): Payer: Self-pay | Admitting: Internal Medicine

## 2013-10-18 DIAGNOSIS — I079 Rheumatic tricuspid valve disease, unspecified: Secondary | ICD-10-CM | POA: Diagnosis not present

## 2013-10-18 DIAGNOSIS — E785 Hyperlipidemia, unspecified: Secondary | ICD-10-CM | POA: Diagnosis not present

## 2013-10-18 DIAGNOSIS — I10 Essential (primary) hypertension: Secondary | ICD-10-CM | POA: Insufficient documentation

## 2013-10-18 DIAGNOSIS — E119 Type 2 diabetes mellitus without complications: Secondary | ICD-10-CM | POA: Diagnosis not present

## 2013-10-18 DIAGNOSIS — R55 Syncope and collapse: Secondary | ICD-10-CM | POA: Diagnosis not present

## 2013-10-18 DIAGNOSIS — I080 Rheumatic disorders of both mitral and aortic valves: Secondary | ICD-10-CM | POA: Diagnosis not present

## 2013-10-18 DIAGNOSIS — I379 Nonrheumatic pulmonary valve disorder, unspecified: Secondary | ICD-10-CM | POA: Insufficient documentation

## 2013-10-18 NOTE — Progress Notes (Signed)
Echo performed. 

## 2013-12-19 DIAGNOSIS — L608 Other nail disorders: Secondary | ICD-10-CM | POA: Diagnosis not present

## 2013-12-19 DIAGNOSIS — E119 Type 2 diabetes mellitus without complications: Secondary | ICD-10-CM | POA: Diagnosis not present

## 2013-12-19 DIAGNOSIS — M25579 Pain in unspecified ankle and joints of unspecified foot: Secondary | ICD-10-CM | POA: Diagnosis not present

## 2013-12-19 DIAGNOSIS — L84 Corns and callosities: Secondary | ICD-10-CM | POA: Diagnosis not present

## 2014-01-04 DIAGNOSIS — E119 Type 2 diabetes mellitus without complications: Secondary | ICD-10-CM | POA: Diagnosis not present

## 2014-01-04 DIAGNOSIS — I10 Essential (primary) hypertension: Secondary | ICD-10-CM | POA: Diagnosis not present

## 2014-01-04 DIAGNOSIS — E785 Hyperlipidemia, unspecified: Secondary | ICD-10-CM | POA: Diagnosis not present

## 2014-01-04 DIAGNOSIS — E039 Hypothyroidism, unspecified: Secondary | ICD-10-CM | POA: Diagnosis not present

## 2014-01-04 DIAGNOSIS — M810 Age-related osteoporosis without current pathological fracture: Secondary | ICD-10-CM | POA: Diagnosis not present

## 2014-01-04 DIAGNOSIS — E538 Deficiency of other specified B group vitamins: Secondary | ICD-10-CM | POA: Diagnosis not present

## 2014-01-11 DIAGNOSIS — Z0000 Encounter for general adult medical examination without abnormal findings: Secondary | ICD-10-CM | POA: Diagnosis not present

## 2014-01-11 DIAGNOSIS — I10 Essential (primary) hypertension: Secondary | ICD-10-CM | POA: Diagnosis not present

## 2014-01-11 DIAGNOSIS — E039 Hypothyroidism, unspecified: Secondary | ICD-10-CM | POA: Diagnosis not present

## 2014-01-11 DIAGNOSIS — E049 Nontoxic goiter, unspecified: Secondary | ICD-10-CM | POA: Diagnosis not present

## 2014-01-11 DIAGNOSIS — Z23 Encounter for immunization: Secondary | ICD-10-CM | POA: Diagnosis not present

## 2014-01-11 DIAGNOSIS — D649 Anemia, unspecified: Secondary | ICD-10-CM | POA: Diagnosis not present

## 2014-01-11 DIAGNOSIS — D509 Iron deficiency anemia, unspecified: Secondary | ICD-10-CM | POA: Diagnosis not present

## 2014-01-11 DIAGNOSIS — E785 Hyperlipidemia, unspecified: Secondary | ICD-10-CM | POA: Diagnosis not present

## 2014-01-11 DIAGNOSIS — E669 Obesity, unspecified: Secondary | ICD-10-CM | POA: Diagnosis not present

## 2014-01-11 DIAGNOSIS — G589 Mononeuropathy, unspecified: Secondary | ICD-10-CM | POA: Diagnosis not present

## 2014-01-15 DIAGNOSIS — E041 Nontoxic single thyroid nodule: Secondary | ICD-10-CM | POA: Diagnosis not present

## 2014-02-14 DIAGNOSIS — Z803 Family history of malignant neoplasm of breast: Secondary | ICD-10-CM | POA: Diagnosis not present

## 2014-02-14 DIAGNOSIS — Z1231 Encounter for screening mammogram for malignant neoplasm of breast: Secondary | ICD-10-CM | POA: Diagnosis not present

## 2014-02-19 DIAGNOSIS — Z6831 Body mass index (BMI) 31.0-31.9, adult: Secondary | ICD-10-CM | POA: Diagnosis not present

## 2014-02-19 DIAGNOSIS — J209 Acute bronchitis, unspecified: Secondary | ICD-10-CM | POA: Diagnosis not present

## 2014-03-02 DIAGNOSIS — L84 Corns and callosities: Secondary | ICD-10-CM | POA: Diagnosis not present

## 2014-03-02 DIAGNOSIS — L608 Other nail disorders: Secondary | ICD-10-CM | POA: Diagnosis not present

## 2014-03-02 DIAGNOSIS — E119 Type 2 diabetes mellitus without complications: Secondary | ICD-10-CM | POA: Diagnosis not present

## 2014-03-19 DIAGNOSIS — I739 Peripheral vascular disease, unspecified: Secondary | ICD-10-CM | POA: Diagnosis not present

## 2014-03-19 DIAGNOSIS — E119 Type 2 diabetes mellitus without complications: Secondary | ICD-10-CM | POA: Diagnosis not present

## 2014-03-21 DIAGNOSIS — L820 Inflamed seborrheic keratosis: Secondary | ICD-10-CM | POA: Diagnosis not present

## 2014-03-21 DIAGNOSIS — L821 Other seborrheic keratosis: Secondary | ICD-10-CM | POA: Diagnosis not present

## 2014-05-04 DIAGNOSIS — L84 Corns and callosities: Secondary | ICD-10-CM | POA: Diagnosis not present

## 2014-05-04 DIAGNOSIS — L608 Other nail disorders: Secondary | ICD-10-CM | POA: Diagnosis not present

## 2014-05-04 DIAGNOSIS — E119 Type 2 diabetes mellitus without complications: Secondary | ICD-10-CM | POA: Diagnosis not present

## 2014-05-15 DIAGNOSIS — I10 Essential (primary) hypertension: Secondary | ICD-10-CM | POA: Diagnosis not present

## 2014-05-15 DIAGNOSIS — E785 Hyperlipidemia, unspecified: Secondary | ICD-10-CM | POA: Diagnosis not present

## 2014-05-15 DIAGNOSIS — E1149 Type 2 diabetes mellitus with other diabetic neurological complication: Secondary | ICD-10-CM | POA: Diagnosis not present

## 2014-05-15 DIAGNOSIS — Z1331 Encounter for screening for depression: Secondary | ICD-10-CM | POA: Diagnosis not present

## 2014-05-15 DIAGNOSIS — M810 Age-related osteoporosis without current pathological fracture: Secondary | ICD-10-CM | POA: Diagnosis not present

## 2014-05-15 DIAGNOSIS — E041 Nontoxic single thyroid nodule: Secondary | ICD-10-CM | POA: Diagnosis not present

## 2014-05-15 DIAGNOSIS — E039 Hypothyroidism, unspecified: Secondary | ICD-10-CM | POA: Diagnosis not present

## 2014-05-15 DIAGNOSIS — R609 Edema, unspecified: Secondary | ICD-10-CM | POA: Diagnosis not present

## 2014-05-23 DIAGNOSIS — E119 Type 2 diabetes mellitus without complications: Secondary | ICD-10-CM | POA: Diagnosis not present

## 2014-05-23 DIAGNOSIS — H2510 Age-related nuclear cataract, unspecified eye: Secondary | ICD-10-CM | POA: Diagnosis not present

## 2014-05-30 DIAGNOSIS — M810 Age-related osteoporosis without current pathological fracture: Secondary | ICD-10-CM | POA: Diagnosis not present

## 2014-07-06 DIAGNOSIS — L608 Other nail disorders: Secondary | ICD-10-CM | POA: Diagnosis not present

## 2014-07-06 DIAGNOSIS — L84 Corns and callosities: Secondary | ICD-10-CM | POA: Diagnosis not present

## 2014-07-06 DIAGNOSIS — E119 Type 2 diabetes mellitus without complications: Secondary | ICD-10-CM | POA: Diagnosis not present

## 2014-08-22 DIAGNOSIS — E041 Nontoxic single thyroid nodule: Secondary | ICD-10-CM | POA: Diagnosis not present

## 2014-09-07 DIAGNOSIS — M2042 Other hammer toe(s) (acquired), left foot: Secondary | ICD-10-CM | POA: Diagnosis not present

## 2014-09-07 DIAGNOSIS — L602 Onychogryphosis: Secondary | ICD-10-CM | POA: Diagnosis not present

## 2014-09-07 DIAGNOSIS — L84 Corns and callosities: Secondary | ICD-10-CM | POA: Diagnosis not present

## 2014-09-07 DIAGNOSIS — E1151 Type 2 diabetes mellitus with diabetic peripheral angiopathy without gangrene: Secondary | ICD-10-CM | POA: Diagnosis not present

## 2014-09-17 DIAGNOSIS — E669 Obesity, unspecified: Secondary | ICD-10-CM | POA: Diagnosis not present

## 2014-09-17 DIAGNOSIS — E041 Nontoxic single thyroid nodule: Secondary | ICD-10-CM | POA: Diagnosis not present

## 2014-09-17 DIAGNOSIS — Z23 Encounter for immunization: Secondary | ICD-10-CM | POA: Diagnosis not present

## 2014-09-17 DIAGNOSIS — M1812 Unilateral primary osteoarthritis of first carpometacarpal joint, left hand: Secondary | ICD-10-CM | POA: Diagnosis not present

## 2014-09-17 DIAGNOSIS — E039 Hypothyroidism, unspecified: Secondary | ICD-10-CM | POA: Diagnosis not present

## 2014-09-17 DIAGNOSIS — E1140 Type 2 diabetes mellitus with diabetic neuropathy, unspecified: Secondary | ICD-10-CM | POA: Diagnosis not present

## 2014-09-17 DIAGNOSIS — Z6831 Body mass index (BMI) 31.0-31.9, adult: Secondary | ICD-10-CM | POA: Diagnosis not present

## 2014-09-17 DIAGNOSIS — I10 Essential (primary) hypertension: Secondary | ICD-10-CM | POA: Diagnosis not present

## 2014-09-17 DIAGNOSIS — M19042 Primary osteoarthritis, left hand: Secondary | ICD-10-CM | POA: Diagnosis not present

## 2014-09-17 DIAGNOSIS — D649 Anemia, unspecified: Secondary | ICD-10-CM | POA: Diagnosis not present

## 2014-09-17 DIAGNOSIS — G629 Polyneuropathy, unspecified: Secondary | ICD-10-CM | POA: Diagnosis not present

## 2014-09-20 DIAGNOSIS — M65332 Trigger finger, left middle finger: Secondary | ICD-10-CM | POA: Diagnosis not present

## 2014-10-02 ENCOUNTER — Encounter: Payer: Self-pay | Admitting: Internal Medicine

## 2014-10-23 DIAGNOSIS — M65332 Trigger finger, left middle finger: Secondary | ICD-10-CM | POA: Diagnosis not present

## 2014-11-22 DIAGNOSIS — E1151 Type 2 diabetes mellitus with diabetic peripheral angiopathy without gangrene: Secondary | ICD-10-CM | POA: Diagnosis not present

## 2014-11-22 DIAGNOSIS — L84 Corns and callosities: Secondary | ICD-10-CM | POA: Diagnosis not present

## 2014-11-22 DIAGNOSIS — L602 Onychogryphosis: Secondary | ICD-10-CM | POA: Diagnosis not present

## 2015-01-17 DIAGNOSIS — Z008 Encounter for other general examination: Secondary | ICD-10-CM | POA: Diagnosis not present

## 2015-01-17 DIAGNOSIS — I10 Essential (primary) hypertension: Secondary | ICD-10-CM | POA: Diagnosis not present

## 2015-01-17 DIAGNOSIS — E538 Deficiency of other specified B group vitamins: Secondary | ICD-10-CM | POA: Diagnosis not present

## 2015-01-17 DIAGNOSIS — E039 Hypothyroidism, unspecified: Secondary | ICD-10-CM | POA: Diagnosis not present

## 2015-01-17 DIAGNOSIS — E559 Vitamin D deficiency, unspecified: Secondary | ICD-10-CM | POA: Diagnosis not present

## 2015-01-24 DIAGNOSIS — K5909 Other constipation: Secondary | ICD-10-CM | POA: Diagnosis not present

## 2015-01-24 DIAGNOSIS — E785 Hyperlipidemia, unspecified: Secondary | ICD-10-CM | POA: Diagnosis not present

## 2015-01-24 DIAGNOSIS — E538 Deficiency of other specified B group vitamins: Secondary | ICD-10-CM | POA: Diagnosis not present

## 2015-01-24 DIAGNOSIS — E1140 Type 2 diabetes mellitus with diabetic neuropathy, unspecified: Secondary | ICD-10-CM | POA: Diagnosis not present

## 2015-01-24 DIAGNOSIS — I10 Essential (primary) hypertension: Secondary | ICD-10-CM | POA: Diagnosis not present

## 2015-01-24 DIAGNOSIS — Z0000 Encounter for general adult medical examination without abnormal findings: Secondary | ICD-10-CM | POA: Diagnosis not present

## 2015-01-24 DIAGNOSIS — Z1389 Encounter for screening for other disorder: Secondary | ICD-10-CM | POA: Diagnosis not present

## 2015-01-24 DIAGNOSIS — E039 Hypothyroidism, unspecified: Secondary | ICD-10-CM | POA: Diagnosis not present

## 2015-01-24 DIAGNOSIS — M810 Age-related osteoporosis without current pathological fracture: Secondary | ICD-10-CM | POA: Diagnosis not present

## 2015-01-24 DIAGNOSIS — M79642 Pain in left hand: Secondary | ICD-10-CM | POA: Diagnosis not present

## 2015-01-24 DIAGNOSIS — E559 Vitamin D deficiency, unspecified: Secondary | ICD-10-CM | POA: Diagnosis not present

## 2015-01-24 DIAGNOSIS — Z6831 Body mass index (BMI) 31.0-31.9, adult: Secondary | ICD-10-CM | POA: Diagnosis not present

## 2015-01-30 DIAGNOSIS — Z1212 Encounter for screening for malignant neoplasm of rectum: Secondary | ICD-10-CM | POA: Diagnosis not present

## 2015-02-06 ENCOUNTER — Ambulatory Visit (INDEPENDENT_AMBULATORY_CARE_PROVIDER_SITE_OTHER): Payer: Medicare Other | Admitting: Internal Medicine

## 2015-02-06 ENCOUNTER — Encounter: Payer: Self-pay | Admitting: Internal Medicine

## 2015-02-06 VITALS — BP 126/80 | HR 74 | Ht 66.0 in | Wt 200.0 lb

## 2015-02-06 DIAGNOSIS — Z860101 Personal history of adenomatous and serrated colon polyps: Secondary | ICD-10-CM

## 2015-02-06 DIAGNOSIS — Z86010 Personal history of colonic polyps: Secondary | ICD-10-CM

## 2015-02-06 DIAGNOSIS — IMO0001 Reserved for inherently not codable concepts without codable children: Secondary | ICD-10-CM

## 2015-02-06 DIAGNOSIS — E1151 Type 2 diabetes mellitus with diabetic peripheral angiopathy without gangrene: Secondary | ICD-10-CM | POA: Diagnosis not present

## 2015-02-06 DIAGNOSIS — L84 Corns and callosities: Secondary | ICD-10-CM | POA: Diagnosis not present

## 2015-02-06 DIAGNOSIS — K219 Gastro-esophageal reflux disease without esophagitis: Secondary | ICD-10-CM

## 2015-02-06 DIAGNOSIS — L602 Onychogryphosis: Secondary | ICD-10-CM | POA: Diagnosis not present

## 2015-02-06 NOTE — Patient Instructions (Signed)
Please follow up with Dr Carlean Purl as needed.  Should you have any bowel problems or bleeding, please let us know.  If you need to take naprosyn on a daily basis, please purchase either prilosec or prevacid over the counter and take daily along with the naprosyn.

## 2015-02-06 NOTE — Progress Notes (Signed)
Subjective:    Patient ID: Gabriela Jefferson, female    DOB: Aug 03, 1932, 81 y.o.   MRN: [id number] Cc: hx of colon polyp HPI The patient is a very nice elderly African-American woman with a history of a diminutive adenomatous polyp removed about 5 years ago. I asked her to come in to discuss whether or not it made sense to proceed with a repeat colonoscopy. She is not having any bowel symptoms at all. She has occasional heartburn that she uses ranitidine as needed for. There is no dysphagia. Her CBC was performed recently at her primary care office and was normal as was a complete metabolic panel. She has continued to do I FOBT testing throughout the years and tells me they have been negative. 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 She occasionally uses naproxen for joint pain but not on a regular basis. Medications, allergies, past medical history, past surgical history, family history and social history are reviewed and updated in the EMR.  Review of Systems As above. Some insomnia some occasional pedal edema.    Objective:   Physical Exam BP 126/80 mmHg  Pulse 74  Ht 5\' 6"  (1.676 m)  Wt 200 lb (90.719 kg)  BMI 32.30 kg/m2 Elderly bw NAD    Assessment & Plan:   Reflux  Hx of adenomatous polyp of colon    Ranitidine prn reflux symptoms  Stop routine colonoscopy - discussed the pros and cons of this and it 82 her life expectancy based upon ages most likely less than 10 years. She only had a diminutive adenoma before and so she is not really at increased risk for cancer and she also has been doing home Hemoccults through primary care which have been negative. I would probably stop those after this year as well but will defer to Dr. Timothy Lasso and the patient.  Daily PPI should be taken for ulcer prophylaxis if she needs chronic daily NSAID  15 minutes time spent with patient > half in counseling coordination of care  I appreciate the opportunity to care for this  patient.   WU:[id number],JYNW Judie Petit, MD

## 2015-02-25 DIAGNOSIS — B029 Zoster without complications: Secondary | ICD-10-CM | POA: Diagnosis not present

## 2015-02-25 DIAGNOSIS — Z6831 Body mass index (BMI) 31.0-31.9, adult: Secondary | ICD-10-CM | POA: Diagnosis not present

## 2015-04-09 DIAGNOSIS — L602 Onychogryphosis: Secondary | ICD-10-CM | POA: Diagnosis not present

## 2015-04-09 DIAGNOSIS — L84 Corns and callosities: Secondary | ICD-10-CM | POA: Diagnosis not present

## 2015-04-09 DIAGNOSIS — E1151 Type 2 diabetes mellitus with diabetic peripheral angiopathy without gangrene: Secondary | ICD-10-CM | POA: Diagnosis not present

## 2015-04-25 DIAGNOSIS — I10 Essential (primary) hypertension: Secondary | ICD-10-CM | POA: Diagnosis not present

## 2015-04-25 DIAGNOSIS — R079 Chest pain, unspecified: Secondary | ICD-10-CM | POA: Diagnosis not present

## 2015-04-25 DIAGNOSIS — Z6830 Body mass index (BMI) 30.0-30.9, adult: Secondary | ICD-10-CM | POA: Diagnosis not present

## 2015-05-14 DIAGNOSIS — Z1231 Encounter for screening mammogram for malignant neoplasm of breast: Secondary | ICD-10-CM | POA: Diagnosis not present

## 2015-05-14 DIAGNOSIS — Z803 Family history of malignant neoplasm of breast: Secondary | ICD-10-CM | POA: Diagnosis not present

## 2015-05-27 DIAGNOSIS — E1140 Type 2 diabetes mellitus with diabetic neuropathy, unspecified: Secondary | ICD-10-CM | POA: Diagnosis not present

## 2015-05-27 DIAGNOSIS — D649 Anemia, unspecified: Secondary | ICD-10-CM | POA: Diagnosis not present

## 2015-05-27 DIAGNOSIS — E785 Hyperlipidemia, unspecified: Secondary | ICD-10-CM | POA: Diagnosis not present

## 2015-05-27 DIAGNOSIS — I10 Essential (primary) hypertension: Secondary | ICD-10-CM | POA: Diagnosis not present

## 2015-05-27 DIAGNOSIS — Z23 Encounter for immunization: Secondary | ICD-10-CM | POA: Diagnosis not present

## 2015-05-27 DIAGNOSIS — Z6830 Body mass index (BMI) 30.0-30.9, adult: Secondary | ICD-10-CM | POA: Diagnosis not present

## 2015-05-27 DIAGNOSIS — E039 Hypothyroidism, unspecified: Secondary | ICD-10-CM | POA: Diagnosis not present

## 2015-05-27 DIAGNOSIS — R079 Chest pain, unspecified: Secondary | ICD-10-CM | POA: Diagnosis not present

## 2015-05-27 DIAGNOSIS — E669 Obesity, unspecified: Secondary | ICD-10-CM | POA: Diagnosis not present

## 2015-05-27 DIAGNOSIS — M79642 Pain in left hand: Secondary | ICD-10-CM | POA: Diagnosis not present

## 2015-05-29 DIAGNOSIS — E119 Type 2 diabetes mellitus without complications: Secondary | ICD-10-CM | POA: Diagnosis not present

## 2015-05-29 DIAGNOSIS — H2513 Age-related nuclear cataract, bilateral: Secondary | ICD-10-CM | POA: Diagnosis not present

## 2015-06-03 DIAGNOSIS — M65332 Trigger finger, left middle finger: Secondary | ICD-10-CM | POA: Diagnosis not present

## 2015-06-17 ENCOUNTER — Other Ambulatory Visit: Payer: Self-pay | Admitting: Orthopedic Surgery

## 2015-06-17 DIAGNOSIS — M65332 Trigger finger, left middle finger: Secondary | ICD-10-CM | POA: Diagnosis not present

## 2015-06-17 DIAGNOSIS — M71342 Other bursal cyst, left hand: Secondary | ICD-10-CM | POA: Diagnosis not present

## 2015-07-03 DIAGNOSIS — Z23 Encounter for immunization: Secondary | ICD-10-CM | POA: Diagnosis not present

## 2015-07-09 DIAGNOSIS — E1151 Type 2 diabetes mellitus with diabetic peripheral angiopathy without gangrene: Secondary | ICD-10-CM | POA: Diagnosis not present

## 2015-07-09 DIAGNOSIS — M25572 Pain in left ankle and joints of left foot: Secondary | ICD-10-CM | POA: Diagnosis not present

## 2015-07-09 DIAGNOSIS — L84 Corns and callosities: Secondary | ICD-10-CM | POA: Diagnosis not present

## 2015-07-09 DIAGNOSIS — L602 Onychogryphosis: Secondary | ICD-10-CM | POA: Diagnosis not present

## 2015-08-27 ENCOUNTER — Encounter: Payer: Self-pay | Admitting: Internal Medicine

## 2015-09-17 DIAGNOSIS — L84 Corns and callosities: Secondary | ICD-10-CM | POA: Diagnosis not present

## 2015-09-17 DIAGNOSIS — E1351 Other specified diabetes mellitus with diabetic peripheral angiopathy without gangrene: Secondary | ICD-10-CM | POA: Diagnosis not present

## 2015-09-17 DIAGNOSIS — L602 Onychogryphosis: Secondary | ICD-10-CM | POA: Diagnosis not present

## 2015-09-23 DIAGNOSIS — Z6830 Body mass index (BMI) 30.0-30.9, adult: Secondary | ICD-10-CM | POA: Diagnosis not present

## 2015-09-23 DIAGNOSIS — E668 Other obesity: Secondary | ICD-10-CM | POA: Diagnosis not present

## 2015-09-23 DIAGNOSIS — G629 Polyneuropathy, unspecified: Secondary | ICD-10-CM | POA: Diagnosis not present

## 2015-09-23 DIAGNOSIS — E1140 Type 2 diabetes mellitus with diabetic neuropathy, unspecified: Secondary | ICD-10-CM | POA: Diagnosis not present

## 2015-09-23 DIAGNOSIS — I10 Essential (primary) hypertension: Secondary | ICD-10-CM | POA: Diagnosis not present

## 2015-09-23 DIAGNOSIS — E038 Other specified hypothyroidism: Secondary | ICD-10-CM | POA: Diagnosis not present

## 2015-09-23 DIAGNOSIS — K5900 Constipation, unspecified: Secondary | ICD-10-CM | POA: Diagnosis not present

## 2015-11-19 DIAGNOSIS — L602 Onychogryphosis: Secondary | ICD-10-CM | POA: Diagnosis not present

## 2015-11-19 DIAGNOSIS — L84 Corns and callosities: Secondary | ICD-10-CM | POA: Diagnosis not present

## 2015-11-19 DIAGNOSIS — M722 Plantar fascial fibromatosis: Secondary | ICD-10-CM | POA: Diagnosis not present

## 2015-11-19 DIAGNOSIS — E1351 Other specified diabetes mellitus with diabetic peripheral angiopathy without gangrene: Secondary | ICD-10-CM | POA: Diagnosis not present

## 2015-12-03 DIAGNOSIS — R262 Difficulty in walking, not elsewhere classified: Secondary | ICD-10-CM | POA: Diagnosis not present

## 2015-12-03 DIAGNOSIS — M722 Plantar fascial fibromatosis: Secondary | ICD-10-CM | POA: Diagnosis not present

## 2015-12-03 DIAGNOSIS — M216X2 Other acquired deformities of left foot: Secondary | ICD-10-CM | POA: Diagnosis not present

## 2015-12-03 DIAGNOSIS — M216X1 Other acquired deformities of right foot: Secondary | ICD-10-CM | POA: Diagnosis not present

## 2015-12-24 DIAGNOSIS — M722 Plantar fascial fibromatosis: Secondary | ICD-10-CM | POA: Diagnosis not present

## 2016-01-28 DIAGNOSIS — R8290 Unspecified abnormal findings in urine: Secondary | ICD-10-CM | POA: Diagnosis not present

## 2016-01-28 DIAGNOSIS — G629 Polyneuropathy, unspecified: Secondary | ICD-10-CM | POA: Diagnosis not present

## 2016-01-28 DIAGNOSIS — E559 Vitamin D deficiency, unspecified: Secondary | ICD-10-CM | POA: Diagnosis not present

## 2016-01-28 DIAGNOSIS — E784 Other hyperlipidemia: Secondary | ICD-10-CM | POA: Diagnosis not present

## 2016-01-28 DIAGNOSIS — E538 Deficiency of other specified B group vitamins: Secondary | ICD-10-CM | POA: Diagnosis not present

## 2016-01-28 DIAGNOSIS — R358 Other polyuria: Secondary | ICD-10-CM | POA: Diagnosis not present

## 2016-01-28 DIAGNOSIS — I10 Essential (primary) hypertension: Secondary | ICD-10-CM | POA: Diagnosis not present

## 2016-01-28 DIAGNOSIS — E1140 Type 2 diabetes mellitus with diabetic neuropathy, unspecified: Secondary | ICD-10-CM | POA: Diagnosis not present

## 2016-01-28 DIAGNOSIS — M810 Age-related osteoporosis without current pathological fracture: Secondary | ICD-10-CM | POA: Diagnosis not present

## 2016-01-28 DIAGNOSIS — E048 Other specified nontoxic goiter: Secondary | ICD-10-CM | POA: Diagnosis not present

## 2016-02-04 DIAGNOSIS — E1140 Type 2 diabetes mellitus with diabetic neuropathy, unspecified: Secondary | ICD-10-CM | POA: Diagnosis not present

## 2016-02-04 DIAGNOSIS — E784 Other hyperlipidemia: Secondary | ICD-10-CM | POA: Diagnosis not present

## 2016-02-04 DIAGNOSIS — E038 Other specified hypothyroidism: Secondary | ICD-10-CM | POA: Diagnosis not present

## 2016-02-04 DIAGNOSIS — E538 Deficiency of other specified B group vitamins: Secondary | ICD-10-CM | POA: Diagnosis not present

## 2016-02-04 DIAGNOSIS — D6489 Other specified anemias: Secondary | ICD-10-CM | POA: Diagnosis not present

## 2016-02-04 DIAGNOSIS — M79642 Pain in left hand: Secondary | ICD-10-CM | POA: Diagnosis not present

## 2016-02-04 DIAGNOSIS — R600 Localized edema: Secondary | ICD-10-CM | POA: Diagnosis not present

## 2016-02-04 DIAGNOSIS — Z0000 Encounter for general adult medical examination without abnormal findings: Secondary | ICD-10-CM | POA: Diagnosis not present

## 2016-02-04 DIAGNOSIS — Z1389 Encounter for screening for other disorder: Secondary | ICD-10-CM | POA: Diagnosis not present

## 2016-02-04 DIAGNOSIS — Z6830 Body mass index (BMI) 30.0-30.9, adult: Secondary | ICD-10-CM | POA: Diagnosis not present

## 2016-02-04 DIAGNOSIS — G6289 Other specified polyneuropathies: Secondary | ICD-10-CM | POA: Diagnosis not present

## 2016-02-04 DIAGNOSIS — H268 Other specified cataract: Secondary | ICD-10-CM | POA: Diagnosis not present

## 2016-02-19 DIAGNOSIS — Z23 Encounter for immunization: Secondary | ICD-10-CM | POA: Diagnosis not present

## 2016-02-25 DIAGNOSIS — L84 Corns and callosities: Secondary | ICD-10-CM | POA: Diagnosis not present

## 2016-02-25 DIAGNOSIS — L602 Onychogryphosis: Secondary | ICD-10-CM | POA: Diagnosis not present

## 2016-02-25 DIAGNOSIS — E1351 Other specified diabetes mellitus with diabetic peripheral angiopathy without gangrene: Secondary | ICD-10-CM | POA: Diagnosis not present

## 2016-04-28 DIAGNOSIS — L602 Onychogryphosis: Secondary | ICD-10-CM | POA: Diagnosis not present

## 2016-04-28 DIAGNOSIS — L84 Corns and callosities: Secondary | ICD-10-CM | POA: Diagnosis not present

## 2016-04-28 DIAGNOSIS — E1351 Other specified diabetes mellitus with diabetic peripheral angiopathy without gangrene: Secondary | ICD-10-CM | POA: Diagnosis not present

## 2016-05-05 ENCOUNTER — Telehealth: Payer: Self-pay | Admitting: Internal Medicine

## 2016-05-05 NOTE — Telephone Encounter (Signed)
Patient reports intermittent pain in her chest.  Worse after a meal.  Does have some dysphagia.  She reports that ranitidine does help some.  She denies SOB.  She also reports pain is worse with some positions.  She will come in and see Alonza Bogus, PA on 05/06/16 10:30

## 2016-05-06 ENCOUNTER — Encounter: Payer: Self-pay | Admitting: Gastroenterology

## 2016-05-06 ENCOUNTER — Observation Stay (HOSPITAL_COMMUNITY)
Admission: EM | Admit: 2016-05-06 | Discharge: 2016-05-08 | Disposition: A | Discharge status: Home / Self Care | Payer: Medicare Other | Attending: Internal Medicine | Admitting: Internal Medicine

## 2016-05-06 ENCOUNTER — Ambulatory Visit (INDEPENDENT_AMBULATORY_CARE_PROVIDER_SITE_OTHER): Payer: Medicare Other | Admitting: Gastroenterology

## 2016-05-06 ENCOUNTER — Telehealth: Payer: Self-pay

## 2016-05-06 ENCOUNTER — Encounter (HOSPITAL_COMMUNITY): Payer: Self-pay | Admitting: *Deleted

## 2016-05-06 ENCOUNTER — Emergency Department (HOSPITAL_COMMUNITY): Payer: Medicare Other

## 2016-05-06 VITALS — BP 124/56 | HR 79 | Ht 65.75 in | Wt 190.2 lb

## 2016-05-06 DIAGNOSIS — E1140 Type 2 diabetes mellitus with diabetic neuropathy, unspecified: Secondary | ICD-10-CM | POA: Insufficient documentation

## 2016-05-06 DIAGNOSIS — R079 Chest pain, unspecified: Secondary | ICD-10-CM | POA: Diagnosis not present

## 2016-05-06 DIAGNOSIS — R0789 Other chest pain: Secondary | ICD-10-CM | POA: Diagnosis not present

## 2016-05-06 DIAGNOSIS — R1310 Dysphagia, unspecified: Secondary | ICD-10-CM | POA: Insufficient documentation

## 2016-05-06 DIAGNOSIS — Z79899 Other long term (current) drug therapy: Secondary | ICD-10-CM | POA: Diagnosis not present

## 2016-05-06 DIAGNOSIS — K219 Gastro-esophageal reflux disease without esophagitis: Secondary | ICD-10-CM | POA: Diagnosis not present

## 2016-05-06 DIAGNOSIS — I10 Essential (primary) hypertension: Secondary | ICD-10-CM | POA: Insufficient documentation

## 2016-05-06 DIAGNOSIS — R0602 Shortness of breath: Secondary | ICD-10-CM | POA: Diagnosis not present

## 2016-05-06 DIAGNOSIS — E039 Hypothyroidism, unspecified: Secondary | ICD-10-CM | POA: Insufficient documentation

## 2016-05-06 DIAGNOSIS — K5909 Other constipation: Secondary | ICD-10-CM

## 2016-05-06 DIAGNOSIS — IMO0001 Reserved for inherently not codable concepts without codable children: Secondary | ICD-10-CM | POA: Diagnosis present

## 2016-05-06 DIAGNOSIS — Z6830 Body mass index (BMI) 30.0-30.9, adult: Secondary | ICD-10-CM | POA: Diagnosis not present

## 2016-05-06 DIAGNOSIS — R9431 Abnormal electrocardiogram [ECG] [EKG]: Secondary | ICD-10-CM | POA: Diagnosis not present

## 2016-05-06 DIAGNOSIS — E785 Hyperlipidemia, unspecified: Secondary | ICD-10-CM | POA: Insufficient documentation

## 2016-05-06 DIAGNOSIS — Z7984 Long term (current) use of oral hypoglycemic drugs: Secondary | ICD-10-CM | POA: Insufficient documentation

## 2016-05-06 LAB — TROPONIN I

## 2016-05-06 LAB — CBC
HEMATOCRIT: 37.4 % (ref 36.0–46.0)
Hemoglobin: 11.9 g/dL — ABNORMAL LOW (ref 12.0–15.0)
MCH: 28.5 pg (ref 26.0–34.0)
MCHC: 31.8 g/dL (ref 30.0–36.0)
MCV: 89.5 fL (ref 78.0–100.0)
PLATELETS: 284 10*3/uL (ref 150–400)
RBC: 4.18 MIL/uL (ref 3.87–5.11)
RDW: 12.8 % (ref 11.5–15.5)
WBC: 9.1 10*3/uL (ref 4.0–10.5)

## 2016-05-06 LAB — GLUCOSE, CAPILLARY: Glucose-Capillary: 201 mg/dL — ABNORMAL HIGH (ref 65–99)

## 2016-05-06 LAB — BASIC METABOLIC PANEL
ANION GAP: 8 (ref 5–15)
BUN: 13 mg/dL (ref 6–20)
CO2: 23 mmol/L (ref 22–32)
Calcium: 9.1 mg/dL (ref 8.9–10.3)
Chloride: 106 mmol/L (ref 101–111)
Creatinine, Ser: 1.11 mg/dL — ABNORMAL HIGH (ref 0.44–1.00)
GFR, EST AFRICAN AMERICAN: 53 mL/min — AB (ref >60)
GFR, EST NON AFRICAN AMERICAN: 45 mL/min — AB (ref >60)
Glucose, Bld: 137 mg/dL — ABNORMAL HIGH (ref 65–99)
POTASSIUM: 4.4 mmol/L (ref 3.5–5.1)
SODIUM: 137 mmol/L (ref 135–145)

## 2016-05-06 LAB — I-STAT TROPONIN, ED: Troponin i, poc: 0.01 ng/mL (ref 0.00–0.08)

## 2016-05-06 MED ORDER — ACETAMINOPHEN 325 MG PO TABS
650.0000 mg | ORAL_TABLET | ORAL | Status: DC | PRN
  Administered 2016-05-06: 650 mg via ORAL
  Filled 2016-05-06 (×2): qty 2

## 2016-05-06 MED ORDER — NITROGLYCERIN 2 % TD OINT
1.0000 [in_us] | TOPICAL_OINTMENT | Freq: Once | TRANSDERMAL | Status: AC
  Administered 2016-05-06: 1 [in_us] via TOPICAL
  Filled 2016-05-06: qty 1

## 2016-05-06 MED ORDER — PANTOPRAZOLE SODIUM 40 MG PO TBEC
40.0000 mg | DELAYED_RELEASE_TABLET | Freq: Every day | ORAL | Status: DC
  Administered 2016-05-06 – 2016-05-07 (×2): 40 mg via ORAL
  Filled 2016-05-06 (×2): qty 1

## 2016-05-06 MED ORDER — INSULIN ASPART 100 UNIT/ML ~~LOC~~ SOLN
0.0000 [IU] | Freq: Every day | SUBCUTANEOUS | Status: DC
  Administered 2016-05-06: 2 [IU] via SUBCUTANEOUS

## 2016-05-06 MED ORDER — INSULIN ASPART 100 UNIT/ML ~~LOC~~ SOLN
0.0000 [IU] | Freq: Three times a day (TID) | SUBCUTANEOUS | Status: DC
  Administered 2016-05-07: 1 [IU] via SUBCUTANEOUS
  Administered 2016-05-07 – 2016-05-08 (×2): 2 [IU] via SUBCUTANEOUS

## 2016-05-06 MED ORDER — ROSUVASTATIN CALCIUM 20 MG PO TABS
20.0000 mg | ORAL_TABLET | Freq: Every day | ORAL | Status: DC
  Administered 2016-05-06 – 2016-05-08 (×3): 20 mg via ORAL
  Filled 2016-05-06 (×3): qty 1

## 2016-05-06 MED ORDER — PANTOPRAZOLE SODIUM 40 MG PO TBEC: 40.0000 mg | DELAYED_RELEASE_TABLET | Freq: Every day | ORAL | Status: DC

## 2016-05-06 MED ORDER — IRBESARTAN 300 MG PO TABS
150.0000 mg | ORAL_TABLET | Freq: Every day | ORAL | Status: DC
  Administered 2016-05-06 – 2016-05-07 (×2): 150 mg via ORAL
  Filled 2016-05-06 (×2): qty 1

## 2016-05-06 MED ORDER — LEVOTHYROXINE SODIUM 25 MCG PO TABS
125.0000 ug | ORAL_TABLET | ORAL | Status: DC
  Administered 2016-05-07 – 2016-05-08 (×2): 125 ug via ORAL
  Filled 2016-05-06 (×2): qty 1

## 2016-05-06 MED ORDER — ONDANSETRON HCL 4 MG/2ML IJ SOLN
4.0000 mg | Freq: Four times a day (QID) | INTRAMUSCULAR | Status: DC | PRN
  Administered 2016-05-06: 4 mg via INTRAVENOUS
  Filled 2016-05-06: qty 2

## 2016-05-06 MED ORDER — ENOXAPARIN SODIUM 40 MG/0.4ML ~~LOC~~ SOLN
40.0000 mg | SUBCUTANEOUS | Status: DC
  Administered 2016-05-06 – 2016-05-07 (×2): 40 mg via SUBCUTANEOUS
  Filled 2016-05-06 (×3): qty 0.4

## 2016-05-06 NOTE — ED Provider Notes (Signed)
CSN: [id number]     Arrival date & time 05/06/16  1532 History   First MD Initiated Contact with Patient 05/06/16 1539     Chief Complaint  Patient presents with  . Chest Pain     (Consider location/radiation/quality/duration/timing/severity/associated sxs/prior Treatment) HPI Complains of right-sided parasternal chest pain At rest described as a heaviness onset 3 days ago intermittent lasting less than 5 minutes of time. Not made better or worse by anything. Presently discomfort is mild. She was treated by EMS with 4 baby aspirins and 1 nitroglycerin sublingual tablet with partial relief of discomfort. No associated nausea or sweatiness. She does admit to exertional dyspnea for one month. She is not short of breath presently. No other associated symptoms. Past Medical History  Diagnosis Date  . Hypertension   . Diabetes mellitus     type 2  . Arthritis   . Osteoporosis   . GERD (gastroesophageal reflux disease)   . Hypothyroidism   . Wears glasses   . Wears dentures     upper  . Hyperlipidemia   . Vitamin B12 deficiency   . Neuropathy ([place])   . Vitamin D deficiency   . Insomnia   . Adenomatous colon polyp   . Plantar fasciitis   . Vertigo    Past Surgical History  Procedure Laterality Date  . Shoulder arthroscopy  2008    left rotator cuff  . Trigger finger release  2005    lt ring  . Cystocele repair    . Rectocele repair    . Abdominal hysterectomy    . Correction hammer toe      left foot  . Tubal ligation    . Colonoscopy    . Trigger finger release  07/12/2012    Procedure: RELEASE TRIGGER FINGER/A-1 PULLEY;  Surgeon: Cammie Sickle., MD;  Location: [facility];  Service: Orthopedics;  Laterality: Right;  right long   . Steriod injection  07/12/2012    Procedure: STEROID INJECTION;  Surgeon: Cammie Sickle., MD;  Location: [facility];  Service: Orthopedics;  Laterality: Right;  right thumbcarpal-metacarpal joint  . Mass  excision Right 06/13/2013    Procedure: EXCISION MASS RIGHT LONG FINGER;  Surgeon: Cammie Sickle., MD;  Location: [facility];  Service: Orthopedics;  Laterality: Right;  . Toe surgery Left   . Hand surgery     Family History  Problem Relation Age of Onset  . COPD Father   . Dementia Mother   . Diabetes Mellitus II Mother   . Diabetes Mellitus II Sister   . Colon cancer Neg Hx    Social History  Substance Use Topics  . Smoking status: Never Smoker   . Smokeless tobacco: Never Used  . Alcohol Use: No   OB History    No data available     Review of Systems  Constitutional: Negative.   HENT: Negative.   Respiratory: Positive for shortness of breath.   Cardiovascular: Positive for chest pain.  Gastrointestinal: Negative.   Musculoskeletal: Negative.   Skin: Negative.   Allergic/Immunologic: Positive for immunocompromised state.       Diabetic  Neurological: Negative.   Psychiatric/Behavioral: Negative.   All other systems reviewed and are negative.     Allergies  Oxycodone  Home Medications   Prior to Admission medications   Medication Sig Start Date End Date Taking? Authorizing Provider  cholecalciferol (VITAMIN D) 1000 UNITS tablet Take 2,000 Units by mouth daily.  Historical Provider, MD  glyBURIDE (DIABETA) 5 MG tablet Take 5 mg by mouth daily with breakfast.    Historical Provider, MD  hydrochlorothiazide (MICROZIDE) 12.5 MG capsule  01/24/15   Historical Provider, MD  irbesartan (AVAPRO) 150 MG tablet Take 150 mg by mouth at bedtime.    Historical Provider, MD  levothyroxine (SYNTHROID, LEVOTHROID) 125 MCG tablet Take 125 mcg by mouth daily.    Historical Provider, MD  metFORMIN (GLUCOPHAGE) 500 MG tablet Take 500 mg by mouth 2 (two) times daily with a meal. Takes 2 at a time=1000mg     Historical Provider, MD  naproxen (NAPROSYN) 500 MG tablet Take 500 mg by mouth daily as needed for moderate pain (arthritis).    Historical Provider, MD   pantoprazole (PROTONIX) 40 MG tablet Take 1 tablet (40 mg total) by mouth daily. 05/06/16   Jessica D Zehr, PA-C  rosuvastatin (CRESTOR) 20 MG tablet Take 20 mg by mouth daily.    Historical Provider, MD  vitamin B-12 (CYANOCOBALAMIN) 100 MCG tablet Take 50 mcg by mouth daily.    Historical Provider, MD   There were no vitals taken for this visit. Physical Exam  Constitutional: She appears well-developed and well-nourished.  HENT:  Head: Normocephalic and atraumatic.  Eyes: Conjunctivae are normal. Pupils are equal, round, and reactive to light.  Neck: Neck supple. No tracheal deviation present. No thyromegaly present.  Cardiovascular: Normal rate and regular rhythm.   No murmur heard. Pulmonary/Chest: Effort normal and breath sounds normal.  Abdominal: Soft. Bowel sounds are normal. She exhibits no distension. There is no tenderness.  Musculoskeletal: Normal range of motion. She exhibits no edema or tenderness.  Neurological: She is alert. Coordination normal.  Skin: Skin is warm and dry. No rash noted.  Psychiatric: She has a normal mood and affect.  Nursing note and vitals reviewed.   ED Course  Procedures (including critical care time) Labs Review Labs Reviewed  BASIC METABOLIC PANEL  [place], ED    Imaging Review No results found. I have personally reviewed and evaluated these images and lab results as part of my medical decision-making.   EKG Interpretation   Date/Time:  Wednesday May 06 2016 15:46:28 EDT Ventricular Rate:  74 PR Interval:    QRS Duration: 80 QT Interval:  370 QTC Calculation: 411 R Axis:   32 Text Interpretation:  Sinus rhythm No significant change since last  tracing Confirmed by Winfred Leeds  MD, Saisha Hogue [phone]) on 05/06/2016 4:00:14 PM     Chest x-ray viewed by me Results for orders placed or performed during the hospital encounter of [id number]  Basic metabolic panel  Result Value Ref Range   Sodium 137 135 - 145 mmol/L    Potassium 4.4 3.5 - 5.1 mmol/L   Chloride 106 101 - 111 mmol/L   CO2 23 22 - 32 mmol/L   Glucose, Bld 137 (H) 65 - 99 mg/dL   BUN 13 6 - 20 mg/dL   Creatinine, Ser 1.11 (H) 0.44 - 1.00 mg/dL   Calcium 9.1 8.9 - 10.3 mg/dL   GFR calc non Af Amer 45 (L) >60 mL/min   GFR calc Af Amer 53 (L) >60 mL/min   Anion gap 8 5 - 15  CBC  Result Value Ref Range   WBC 9.1 4.0 - 10.5 K/uL   RBC 4.18 3.87 - 5.11 MIL/uL   Hemoglobin 11.9 (L) 12.0 - 15.0 g/dL   HCT 37.4 36.0 - 46.0 %   MCV 89.5 78.0 - 100.0  fL   MCH 28.5 26.0 - 34.0 pg   MCHC 31.8 30.0 - 36.0 g/dL   RDW 12.8 11.5 - 15.5 %   Platelets 284 150 - 400 K/uL  I-stat troponin, ED  Result Value Ref Range   Troponin i, poc 0.01 0.00 - 0.08 ng/mL   Comment 3           Dg Chest 2 View  05/06/2016  CLINICAL DATA:  Midsternal chest pain radiates posteriorly. Shortness of breath. EXAM: CHEST  2 VIEW COMPARISON:  04/22/2011. FINDINGS: Hyperexpansion is consistent with emphysema. Nodular density right lower lung is unchanged. The cardiopericardial silhouette is within normal limits for size. Thoracolumbar scoliosis is stable in the interval. The visualized bony structures of the thorax are intact. IMPRESSION: Stable.  Emphysema without acute findings. Electronically Signed   By: Misty Stanley M.D.   On: 05/06/2016 17:05    MDM  Heart score equals 5 based on clinical suspicion of history, risk factors and age. Dr.Akula term hospitalist service consulted and will evaluate patient in the emergency department for overnight stay Dx chest pain at rest Final diagnoses:  None        Orlie Dakin, MD 05/06/16 1726

## 2016-05-06 NOTE — Telephone Encounter (Signed)
Amy from [facility] called and they are sending patient to the ED and wanted [place] to cancel her DG Esophagus for tomorrow, I called the hospital and canceled.

## 2016-05-06 NOTE — H&P (Signed)
History and Physical    ELEASHA RESTO GEX:[id number] DOB: 22-Nov-1931 DOA: 05/06/2016  PCP: Gwen Pounds, MD  Patient coming from: [facility]  Chief Complaint: substernal pressure today.  HPI: Gabriela Jefferson is a 82 y.o. female with medical history significant of hypertension, DM, hyperlipidemia, presents to ED, with symptoms of sub sternal chest pressure and right sided chest pressure occurred earlier today at rest. She reports she had intermittent symptoms since Sunday, not associated with any activity, resolves spontaneosly. She is a poor historian and history is not detailed. She denies any nausea, vomiting or palpitation . She reports she had a syncopal episode last year. She also reports some DOE, for a few weeks now. She denies any orthopnea or PND. She denies pedal edema. She denies any diarrhea, dysuria. She denies any headache at this time.   ED Course: on arrival to ED, labs significant for a  creatinine of 1.1, hemoglobin of 11.9. EKG is NSR, and troponin is negative. She is referred to medical service for evaluation of ACS.   Review of Systems: As per HPI otherwise 10 point review of systems negative.    Past Medical History  Diagnosis Date  . Hypertension   . Diabetes mellitus     type 2  . Arthritis   . Osteoporosis   . GERD (gastroesophageal reflux disease)   . Hypothyroidism   . Wears glasses   . Wears dentures     upper  . Hyperlipidemia   . Vitamin B12 deficiency   . Neuropathy ([facility])   . Vitamin D deficiency   . Insomnia   . Adenomatous colon polyp   . Plantar fasciitis   . Vertigo     Past Surgical History  Procedure Laterality Date  . Shoulder arthroscopy  2008    left rotator cuff  . Trigger finger release  2005    lt ring  . Cystocele repair    . Rectocele repair    . Abdominal hysterectomy    . Correction hammer toe      left foot  . Tubal ligation    . Colonoscopy    . Trigger finger release  07/12/2012    Procedure: RELEASE TRIGGER  FINGER/A-1 PULLEY;  Surgeon: Wyn Forster., MD;  Location: [facility];  Service: Orthopedics;  Laterality: Right;  right long   . Steriod injection  07/12/2012    Procedure: STEROID INJECTION;  Surgeon: Wyn Forster., MD;  Location: [facility];  Service: Orthopedics;  Laterality: Right;  right thumbcarpal-metacarpal joint  . Mass excision Right 06/13/2013    Procedure: EXCISION MASS RIGHT LONG FINGER;  Surgeon: Wyn Forster., MD;  Location: [facility];  Service: Orthopedics;  Laterality: Right;  . Toe surgery Left   . Hand surgery       reports that she has never smoked. She has never used smokeless tobacco. She reports that she does not drink alcohol or use illicit drugs.  Allergies  Allergen Reactions  . Oxycodone Nausea And Vomiting    Family History  Problem Relation Age of Onset  . COPD Father   . Dementia Mother   . Diabetes Mellitus II Mother   . Diabetes Mellitus II Sister   . Colon cancer Neg Hx      Prior to Admission medications   Medication Sig Start Date End Date Taking? Authorizing Provider  cholecalciferol (VITAMIN D) 1000 UNITS tablet Take 2,000 Units by mouth daily.   Yes  Historical Provider, MD  glyBURIDE (DIABETA) 5 MG tablet Take 5 mg by mouth daily with breakfast.   Yes Historical Provider, MD  hydrochlorothiazide (MICROZIDE) 12.5 MG capsule Take 12.5 mg by mouth daily as needed (for edema).  02/04/16  Yes Historical Provider, MD  irbesartan (AVAPRO) 150 MG tablet Take 150 mg by mouth daily.    Yes Historical Provider, MD  levothyroxine (SYNTHROID, LEVOTHROID) 125 MCG tablet Take 125 mcg by mouth daily before breakfast.    Yes Historical Provider, MD  metFORMIN (GLUCOPHAGE) 500 MG tablet Take 500 mg by mouth 2 (two) times daily with a meal.    Yes Historical Provider, MD  naproxen (NAPROSYN) 500 MG tablet Take 500 mg by mouth daily as needed for moderate pain (arthritis).   Yes Historical Provider, MD    pantoprazole (PROTONIX) 40 MG tablet Take 1 tablet (40 mg total) by mouth daily. 05/06/16  Yes Jessica D Zehr, PA-C  rosuvastatin (CRESTOR) 20 MG tablet Take 20 mg by mouth every evening.    Yes Historical Provider, MD  vitamin B-12 (CYANOCOBALAMIN) 100 MCG tablet Take 50 mcg by mouth daily.   Yes Historical Provider, MD    Physical Exam: Filed Vitals:   05/06/16 1600 05/06/16 1630 05/06/16 1730 05/06/16 1800  BP: 137/53 133/47 140/57 156/58  Pulse: 72 71 74 80  Temp:      TempSrc:      Resp: 17 14 20 17   SpO2: 100% 100% 100% 100%      Constitutional: NAD, calm, comfortable Filed Vitals:   05/06/16 1600 05/06/16 1630 05/06/16 1730 05/06/16 1800  BP: 137/53 133/47 140/57 156/58  Pulse: 72 71 74 80  Temp:      TempSrc:      Resp: 17 14 20 17   SpO2: 100% 100% 100% 100%   Eyes: PERRL, lids and conjunctivae normal ENMT: Mucous membranes are moist. Posterior pharynx clear of any exudate or lesions Neck: normal, supple, no masses, no thyromegaly Respiratory: clear to auscultation bilaterally, no wheezing, no crackles. Normal respiratory effort. No accessory muscle use.  Cardiovascular: Regular rate and rhythm, no murmurs / rubs / gallops. No extremity edema. 2+ pedal pulses. Abdomen: no tenderness, no masses palpated. No hepatosplenomegaly. Bowel sounds positive.  Musculoskeletal: no clubbing / cyanosis. No joint deformity upper and lower extremities. Good ROM, no contractures. Normal muscle tone.  Skin: no rashes, lesions, ulcers. No induration Neurologic: CN 2-12 grossly intact. Sensation intact, DTR normal. Strength 5/5 in all 4.       Labs on Admission: I have personally reviewed following labs and imaging studies  CBC:  Recent Labs Lab 05/06/16 1543  WBC 9.1  HGB 11.9*  HCT 37.4  MCV 89.5  PLT 284   Basic Metabolic Panel:  Recent Labs Lab 05/06/16 1543  NA 137  K 4.4  CL 106  CO2 23  GLUCOSE 137*  BUN 13  CREATININE 1.11*  CALCIUM 9.1    GFR: Estimated Creatinine Clearance: 43.7 mL/min (by C-G formula based on Cr of 1.11). Liver Function Tests: No results for input(s): AST, ALT, ALKPHOS, BILITOT, PROT, ALBUMIN in the last 168 hours. No results for input(s): LIPASE, AMYLASE in the last 168 hours. No results for input(s): AMMONIA in the last 168 hours. Coagulation Profile: No results for input(s): INR, PROTIME in the last 168 hours. Cardiac Enzymes: No results for input(s): CKTOTAL, CKMB, CKMBINDEX, TROPONINI in the last 168 hours. BNP (last 3 results) No results for input(s): PROBNP in the last 8760 hours. HbA1C: No  results for input(s): HGBA1C in the last 72 hours. CBG: No results for input(s): GLUCAP in the last 168 hours. Lipid Profile: No results for input(s): CHOL, HDL, LDLCALC, TRIG, CHOLHDL, LDLDIRECT in the last 72 hours. Thyroid Function Tests: No results for input(s): TSH, T4TOTAL, FREET4, T3FREE, THYROIDAB in the last 72 hours. Anemia Panel: No results for input(s): VITAMINB12, FOLATE, FERRITIN, TIBC, IRON, RETICCTPCT in the last 72 hours. Urine analysis: No results found for: COLORURINE, APPEARANCEUR, LABSPEC, PHURINE, GLUCOSEU, HGBUR, BILIRUBINUR, KETONESUR, PROTEINUR, UROBILINOGEN, NITRITE, LEUKOCYTESUR Sepsis Labs: !!!!!!!!!!!!!!!!!!!!!!!!!!!!!!!!!!!!!!!!!!!! @LABRCNTIP (procalcitonin:4,lacticidven:4) )No results found for this or any previous visit (from the past 240 hour(s)).   Radiological Exams on Admission: Dg Chest 2 View  05/06/2016  CLINICAL DATA:  Midsternal chest pain radiates posteriorly. Shortness of breath. EXAM: CHEST  2 VIEW COMPARISON:  04/22/2011. FINDINGS: Hyperexpansion is consistent with emphysema. Nodular density right lower lung is unchanged. The cardiopericardial silhouette is within normal limits for size. Thoracolumbar scoliosis is stable in the interval. The visualized bony structures of the thorax are intact. IMPRESSION: Stable.  Emphysema without acute findings.  Electronically Signed   By: Kennith Center M.D.   On: 05/06/2016 17:05    EKG: Independently reviewed. NSR  Assessment/Plan Active Problems:   Chest pain  right sided and sub sternal chest pressure: Admit to telemetry and evaluate for ACS.  Currently chest pain free.  Received 4  Tabs of 81 mg aspirin and nitro paste in ED.  Serial troponins and echocardiogram ordered.  Resume aspirin and statin.  Get lipid panel in am.  Resume protonix.    Hypertension: Controlled. Resume home meds.    Diabetes Mellitus: CBG (last 3)  No results for input(s): GLUCAP in the last 72 hours.  Resume SSI.  hgab1c ordered.   Hyperlipidemia: Get lipid panel  In am.    DVT prophylaxis: lovenox.  Code Status:  (Full) Family Communication: family at bedside.  Disposition Plan: 1 to 2 days.  Consults called: none Admission status:obs tele   Reign Bartnick MD [facility] Pager [phone]  If 7PM-7AM, please contact night-coverage [email or web address] Password [facility]  05/06/2016, 6:22 PM

## 2016-05-06 NOTE — Patient Instructions (Addendum)
You have been scheduled for a Barium Esophogram at [facility] (1st floor of the hospital) on 05/07/16 at 10 am. Please arrive 15 minutes prior to your appointment for registration. Make certain not to have anything to eat or drink 6 hours prior to your test. If you need to reschedule for any reason, please contact radiology at [phone] to do so. __________________________________________________________________ A barium swallow is an examination that concentrates on views of the esophagus. This tends to be a double contrast exam (barium and two liquids which, when combined, create a gas to distend the wall of the oesophagus) or single contrast (non-ionic iodine based). The study is usually tailored to your symptoms so a good history is essential. Attention is paid during the study to the form, structure and configuration of the esophagus, looking for functional disorders (such as aspiration, dysphagia, achalasia, motility and reflux) EXAMINATION You may be asked to change into a gown, depending on the type of swallow being performed. A radiologist and radiographer will perform the procedure. The radiologist will advise you of the type of contrast selected for your procedure and direct you during the exam. You will be asked to stand, sit or lie in several different positions and to hold a small amount of fluid in your mouth before being asked to swallow while the imaging is performed .In some instances you may be asked to swallow barium coated marshmallows to assess the motility of a solid food bolus. The exam can be recorded as a digital or video fluoroscopy procedure. POST PROCEDURE It will take 1-2 days for the barium to pass through your system. To facilitate this, it is important, unless otherwise directed, to increase your fluids for the next 24-48hrs and to resume your normal diet.  This test typically takes about 30 minutes to  perform. __________________________________________________________________________________  Stop Ranitidine, begin Protonix 40 mg daily 30-60 min prior to dinner.  Appointment today at 2 pm with Colletta Maryland at [facility] office.

## 2016-05-06 NOTE — ED Notes (Signed)
Pt arrives via EMS from PCP. Pt had been c/o right sided chest pain since Sunday, pressure on right side. PCP did EKG and showed twave inversion so called EMS. EMS gave 324 ASA and 1 NTG which took pain from 6/10 to 2/10.

## 2016-05-07 ENCOUNTER — Observation Stay (HOSPITAL_COMMUNITY): Payer: Medicare Other

## 2016-05-07 ENCOUNTER — Encounter: Payer: Self-pay | Admitting: Gastroenterology

## 2016-05-07 ENCOUNTER — Observation Stay (HOSPITAL_BASED_OUTPATIENT_CLINIC_OR_DEPARTMENT_OTHER): Payer: Medicare Other

## 2016-05-07 ENCOUNTER — Ambulatory Visit (HOSPITAL_COMMUNITY): Payer: Medicare Other

## 2016-05-07 DIAGNOSIS — R1310 Dysphagia, unspecified: Secondary | ICD-10-CM | POA: Diagnosis not present

## 2016-05-07 DIAGNOSIS — R079 Chest pain, unspecified: Secondary | ICD-10-CM | POA: Diagnosis not present

## 2016-05-07 DIAGNOSIS — K219 Gastro-esophageal reflux disease without esophagitis: Secondary | ICD-10-CM

## 2016-05-07 DIAGNOSIS — IMO0001 Reserved for inherently not codable concepts without codable children: Secondary | ICD-10-CM | POA: Insufficient documentation

## 2016-05-07 DIAGNOSIS — K5900 Constipation, unspecified: Secondary | ICD-10-CM | POA: Insufficient documentation

## 2016-05-07 DIAGNOSIS — R0789 Other chest pain: Secondary | ICD-10-CM | POA: Diagnosis not present

## 2016-05-07 LAB — LIPID PANEL
Cholesterol: 142 mg/dL (ref 0–200)
HDL: 55 mg/dL (ref >40)
LDL CALC: 54 mg/dL (ref 0–99)
TRIGLYCERIDES: 166 mg/dL — AB (ref <150)
Total CHOL/HDL Ratio: 2.6 RATIO
VLDL: 33 mg/dL (ref 0–40)

## 2016-05-07 LAB — TROPONIN I

## 2016-05-07 LAB — GLUCOSE, CAPILLARY
GLUCOSE-CAPILLARY: 109 mg/dL — AB (ref 65–99)
GLUCOSE-CAPILLARY: 161 mg/dL — AB (ref 65–99)
GLUCOSE-CAPILLARY: 123 mg/dL — AB (ref 65–99)
Glucose-Capillary: 163 mg/dL — ABNORMAL HIGH (ref 65–99)

## 2016-05-07 LAB — ECHOCARDIOGRAM COMPLETE
HEIGHTINCHES: 66 in
WEIGHTICAEL: 2984.15 [oz_av]

## 2016-05-07 LAB — HEMOGLOBIN A1C
HEMOGLOBIN A1C: 7.6 % — AB (ref 4.8–5.6)
MEAN PLASMA GLUCOSE: 171 mg/dL

## 2016-05-07 MED ORDER — ASPIRIN 325 MG PO TABS
325.0000 mg | ORAL_TABLET | Freq: Every day | ORAL | Status: DC
Start: 2016-05-07 — End: 2016-05-08
  Administered 2016-05-08: 325 mg via ORAL
  Filled 2016-05-07: qty 1

## 2016-05-07 MED ORDER — POLYETHYLENE GLYCOL 3350 17 G PO PACK
17.0000 g | PACK | Freq: Every day | ORAL | Status: DC | PRN
  Administered 2016-05-07: 17 g via ORAL
  Filled 2016-05-07: qty 1

## 2016-05-07 MED ORDER — GI COCKTAIL ~~LOC~~: 30.0000 mL | Freq: Three times a day (TID) | ORAL | Status: DC | PRN

## 2016-05-07 MED ORDER — PANTOPRAZOLE SODIUM 40 MG PO TBEC
40.0000 mg | DELAYED_RELEASE_TABLET | Freq: Two times a day (BID) | ORAL | Status: DC
  Administered 2016-05-08: 40 mg via ORAL
  Filled 2016-05-07: qty 1

## 2016-05-07 NOTE — Care Management Note (Signed)
Case Management Note  Patient Details  Name: Gabriela Jefferson MRN: [id number] Date of Birth: 1931-12-06  Subjective/Objective:    Patient lives with daughter, presents with chest pain,  pta indep, she does not have any HH services at this time.  NCM will cont to follow for dc needs.                 Action/Plan:   Expected Discharge Date:                  Expected Discharge Plan:  Home/Self Care  In-House Referral:     Discharge planning Services  CM Consult  Post Acute Care Choice:    Choice offered to:     DME Arranged:    DME Agency:     HH Arranged:    HH Agency:     Status of Service:  In process, will continue to follow  If discussed at Long Length of Stay Meetings, dates discussed:    Additional Comments:  Zenon Mayo, RN 05/07/2016, 4:28 PM

## 2016-05-07 NOTE — Progress Notes (Signed)
05/07/2016 Gabriela Jefferson [id number] 12/18/1931   History of Present Illness:  This is a pleasant 82 year old female who is known to Dr. Darrick Grinder, last seen in 01/2015 for reflux.  She is currently taking ranitidine 300 mg daily currently for this.  Says that she still has some reflux, mostly at nighttime even with this medication.  Comes in today mostly with complaints of chest pain, however.  Says that it just began on Sunday and has been constant since that time.  Describes as right sided chest pain that sometimes feels burning and other times like pressure.  Has SOB with exertion but no change in that.  Called her PCP yesterday and was told that he was out of town so she made an appt here.  Then apparently PCP's office called back and said that she could be seen by someone else in their office but she told them that she already had an appt here.  Also says that she's been having some issues with swallowing solid foods for a couple of months.  Says that they feel like they get hung up for a short time; says that it occurs with anything that she eats, even oatmeal, etc.  Says that when it gets stuck it eventually comes back up.    Also reports constipation.  Says that she drinks prune juice and eats prunes, which helps for the most part.    Current Medications, Allergies, Past Medical History, Past Surgical History, Family History and Social History were reviewed in [employer] record.   Physical Exam: BP 124/56 mmHg  Pulse 79  Ht 5' 5.75" (1.67 m)  Wt 190 lb 4 oz (86.297 kg)  BMI 30.94 kg/m2 General: Well developed black female in no acute distress Head: Normocephalic and atraumatic Eyes:  Sclerae anicteric, conjunctiva pink  Ears: Normal auditory acuity Lungs: Clear throughout to auscultation Heart: Regular rate and rhythm Abdomen: Soft, non-distended.  Normal bowel sounds.  Non-tender. Musculoskeletal: Symmetrical with no gross deformities  Extremities: No  edema  Neurological: Alert oriented x 4, grossly non-focal Psychological:  Alert and cooperative. Normal mood and affect  Assessment and Recommendations: -Chest pain:  Right sided described as some burning but also pressure, constant since Sunday.  Also complains of reflux at times as well so this chest pain could be due to reflux but I would feel better if she would be evaluated by her PCP and possibly have an EKG in their office today.  Will call for an appt for her (scheduled in their office this afternoon).  I am going to stop her ranitidine and have her start pantoprazole 40 mg daily, 30-60 minutes before dinner since reflux worse at night. -Dysphagia:  Could also be due to reflux.  Will evaluate with barium esophagram. -Constipation:  Overall prunes and prune juice helps for the most part.  I discussed starting Miralax daily if continues to have constipation despite her current regimen.

## 2016-05-07 NOTE — Progress Notes (Signed)
PROGRESS NOTE    Gabriela Jefferson  WUJ:[id number] DOB: 10/23/1931 DOA: 05/06/2016 PCP: Gwen Pounds, MD    Brief Narrative: 82 year old admitted for chest pain.    Assessment & Plan:   Active Problems:   Chest pain   Atypical chest pain: - admitted to telemetry. Serial troponins negative.  Echo unremarkable. Good LVEF , grade 1 diastolic dysfunction.  EKG unremarkable.  Stress test in am.  Heart score is 4 to 5.  Resume aspirin, prn GI cocktail  Hypertension: well controlled.  Diabetes Mellitus: hgba1c is 7.6 Resume SSI.   Dysphagia to solids ,  Barium esophago gram ordered.  PPI.  slp  eval ordered.   DVT prophylaxis: (Lovenox/) Code Status: (Full) Family Communication: family at bedside.  Disposition Plan: pending further eval.   Consultants:   None.    Procedures: echocardiogram.    Antimicrobials: none   Subjective: Reports some sub sternal chest pressure, improved .  Objective: Filed Vitals:   05/06/16 1901 05/06/16 2144 05/07/16 0522 05/07/16 1157  BP: 140/61 144/54 130/44 120/47  Pulse: 86 85 72 66  Temp: 98.6 F (37 C) 98.2 F (36.8 C) 97.9 F (36.6 C) 98.4 F (36.9 C)  TempSrc: Oral Oral Oral Oral  Resp: 18 18 18 18   Height: 5\' 6"  (1.676 m)     Weight: 84.687 kg (186 lb 11.2 oz)  84.6 kg (186 lb 8.2 oz)   SpO2: 99% 99% 98% 96%    Intake/Output Summary (Last 24 hours) at 05/07/16 1808 Last data filed at 05/07/16 1300  Gross per 24 hour  Intake    920 ml  Output   1650 ml  Net   -730 ml   Filed Weights   05/06/16 1901 05/07/16 0522  Weight: 84.687 kg (186 lb 11.2 oz) 84.6 kg (186 lb 8.2 oz)    Examination:  General exam: Appears calm and comfortable  Respiratory system: Clear to auscultation. Respiratory effort normal. Cardiovascular system: S1 & S2 heard, RRR. No JVD, murmurs, rubs, gallops or clicks. No pedal edema. Gastrointestinal system: Abdomen is nondistended, soft and nontender. No organomegaly or masses felt.  Normal bowel sounds heard. Central nervous system: Alert and oriented. No focal neurological deficits. Extremities: Symmetric 5 x 5 power. Skin: No rashes, lesions or ulcers     Data Reviewed: I have personally reviewed following labs and imaging studies  CBC:  Recent Labs Lab 05/06/16 1543  WBC 9.1  HGB 11.9*  HCT 37.4  MCV 89.5  PLT 284   Basic Metabolic Panel:  Recent Labs Lab 05/06/16 1543  NA 137  K 4.4  CL 106  CO2 23  GLUCOSE 137*  BUN 13  CREATININE 1.11*  CALCIUM 9.1   GFR: Estimated Creatinine Clearance: 43.5 mL/min (by C-G formula based on Cr of 1.11). Liver Function Tests: No results for input(s): AST, ALT, ALKPHOS, BILITOT, PROT, ALBUMIN in the last 168 hours. No results for input(s): LIPASE, AMYLASE in the last 168 hours. No results for input(s): AMMONIA in the last 168 hours. Coagulation Profile: No results for input(s): INR, PROTIME in the last 168 hours. Cardiac Enzymes:  Recent Labs Lab 05/06/16 1814 05/06/16 1936 05/07/16 0207 05/07/16 0720  TROPONINI <0.03 <0.03 <0.03 <0.03   BNP (last 3 results) No results for input(s): PROBNP in the last 8760 hours. HbA1C:  Recent Labs  05/06/16 1543  HGBA1C 7.6*   CBG:  Recent Labs Lab 05/06/16 2144 05/07/16 0636 05/07/16 1129 05/07/16 1625  GLUCAP 201* 109* 161* 123*  Lipid Profile:  Recent Labs  05/07/16 0207  CHOL 142  HDL 55  LDLCALC 54  TRIG 166*  CHOLHDL 2.6   Thyroid Function Tests: No results for input(s): TSH, T4TOTAL, FREET4, T3FREE, THYROIDAB in the last 72 hours. Anemia Panel: No results for input(s): VITAMINB12, FOLATE, FERRITIN, TIBC, IRON, RETICCTPCT in the last 72 hours. Sepsis Labs: No results for input(s): PROCALCITON, LATICACIDVEN in the last 168 hours.  No results found for this or any previous visit (from the past 240 hour(s)).       Radiology Studies: Dg Chest 2 View  05/06/2016  CLINICAL DATA:  Midsternal chest pain radiates posteriorly.  Shortness of breath. EXAM: CHEST  2 VIEW COMPARISON:  04/22/2011. FINDINGS: Hyperexpansion is consistent with emphysema. Nodular density right lower lung is unchanged. The cardiopericardial silhouette is within normal limits for size. Thoracolumbar scoliosis is stable in the interval. The visualized bony structures of the thorax are intact. IMPRESSION: Stable.  Emphysema without acute findings. Electronically Signed   By: Kennith Center M.D.   On: 05/06/2016 17:05   Dg Esophagus  05/07/2016  CLINICAL DATA:  Dysphagia and reflux. EXAM: ESOPHOGRAM/BARIUM SWALLOW TECHNIQUE: Single contrast examination was performed using  thin barium. FLUOROSCOPY TIME:  Radiation Exposure Index (as provided by the fluoroscopic device): If the device does not provide the exposure index: Fluoroscopy Time:  1 minutes and 12 seconds. Number of Acquired Images: COMPARISON:  None. FINDINGS: No evidence for gross esophageal mass lesion. No evidence for mucosal ulceration or esophageal diverticulum. Assessment of esophageal motility demonstrates disruption of primary peristalsis on all swallows. 13 mm barium tablet passes readily into the stomach when taken with water. IMPRESSION: Nonspecific esophageal motility disorder. No evidence for esophageal stricture. Electronically Signed   By: Kennith Center M.D.   On: 05/07/2016 17:41        Scheduled Meds: . enoxaparin (LOVENOX) injection  40 mg Subcutaneous Q24H  . insulin aspart  0-5 Units Subcutaneous QHS  . insulin aspart  0-9 Units Subcutaneous TID WC  . irbesartan  150 mg Oral QHS  . levothyroxine  125 mcg Oral Q24H  . pantoprazole  40 mg Oral Daily  . rosuvastatin  20 mg Oral Daily   Continuous Infusions:       Time spent: 25 minutes    Ohana Birdwell, MD [facility] Pager [phone]  If 7PM-7AM, please contact night-coverage [email or web address] Password [email or web address] 05/07/2016, 6:08 PM

## 2016-05-07 NOTE — Care Management Obs Status (Signed)
[place] NOTIFICATION   Patient Details  Name: Gabriela Jefferson MRN: [id number] Date of Birth: Nov 16, 1931   Medicare Observation Status Notification Given:  Yes    Zenon Mayo, RN 05/07/2016, 4:22 PM

## 2016-05-07 NOTE — Progress Notes (Signed)
  Echocardiogram 2D Echocardiogram has been performed.  Gabriela Jefferson 05/07/2016, 3:33 PM

## 2016-05-08 ENCOUNTER — Observation Stay (HOSPITAL_BASED_OUTPATIENT_CLINIC_OR_DEPARTMENT_OTHER): Payer: Medicare Other

## 2016-05-08 ENCOUNTER — Observation Stay (HOSPITAL_COMMUNITY): Payer: Medicare Other

## 2016-05-08 DIAGNOSIS — E785 Hyperlipidemia, unspecified: Secondary | ICD-10-CM | POA: Diagnosis not present

## 2016-05-08 DIAGNOSIS — R1310 Dysphagia, unspecified: Secondary | ICD-10-CM | POA: Diagnosis not present

## 2016-05-08 DIAGNOSIS — K219 Gastro-esophageal reflux disease without esophagitis: Secondary | ICD-10-CM

## 2016-05-08 DIAGNOSIS — R079 Chest pain, unspecified: Secondary | ICD-10-CM

## 2016-05-08 DIAGNOSIS — Z79899 Other long term (current) drug therapy: Secondary | ICD-10-CM | POA: Diagnosis not present

## 2016-05-08 DIAGNOSIS — Z7984 Long term (current) use of oral hypoglycemic drugs: Secondary | ICD-10-CM | POA: Diagnosis not present

## 2016-05-08 DIAGNOSIS — E1140 Type 2 diabetes mellitus with diabetic neuropathy, unspecified: Secondary | ICD-10-CM | POA: Diagnosis not present

## 2016-05-08 DIAGNOSIS — R0789 Other chest pain: Secondary | ICD-10-CM | POA: Diagnosis not present

## 2016-05-08 DIAGNOSIS — E039 Hypothyroidism, unspecified: Secondary | ICD-10-CM | POA: Diagnosis not present

## 2016-05-08 DIAGNOSIS — I10 Essential (primary) hypertension: Secondary | ICD-10-CM | POA: Diagnosis not present

## 2016-05-08 LAB — NM MYOCAR MULTI W/SPECT W/WALL MOTION / EF
CHL CUP RESTING HR STRESS: 68 {beats}/min
CHL CUP STRESS STAGE 1 GRADE: 0 %
CHL CUP STRESS STAGE 1 SPEED: 0 mph
CHL CUP STRESS STAGE 2 GRADE: 0 %
CHL CUP STRESS STAGE 2 HR: 70 {beats}/min
CHL CUP STRESS STAGE 3 HR: 85 {beats}/min
CHL CUP STRESS STAGE 3 SBP: 141 mmHg
CHL CUP STRESS STAGE 3 SPEED: 0 mph
CHL CUP STRESS STAGE 4 DBP: 48 mmHg
CHL CUP STRESS STAGE 4 GRADE: 0 %
CHL CUP STRESS STAGE 4 SBP: 143 mmHg
CSEPED: 0 min
CSEPEDS: 0 s
CSEPEW: 1 METS
CSEPPBP: 143 mmHg
LV dias vol: 64 mL (ref 46–106)
LVSYSVOL: 20 mL
MPHR: 139 {beats}/min
NUC STRESS TID: 1.13
Peak HR: 79 {beats}/min
Percent HR: 69 %
Percent of predicted max HR: 56 %
RATE: 0.12
SDS: 4
SRS: 6
SSS: 8
Stage 1 DBP: 55 mmHg
Stage 1 HR: 70 {beats}/min
Stage 1 SBP: 143 mmHg
Stage 2 Speed: 0 mph
Stage 3 DBP: 65 mmHg
Stage 3 Grade: 0 %
Stage 4 HR: 79 {beats}/min
Stage 4 Speed: 0 mph

## 2016-05-08 LAB — GLUCOSE, CAPILLARY
GLUCOSE-CAPILLARY: 169 mg/dL — AB (ref 65–99)
Glucose-Capillary: 145 mg/dL — ABNORMAL HIGH (ref 65–99)

## 2016-05-08 MED ORDER — TECHNETIUM TC 99M TETROFOSMIN IV KIT
30.0000 | PACK | Freq: Once | INTRAVENOUS | Status: AC | PRN
  Administered 2016-05-08: 30 via INTRAVENOUS

## 2016-05-08 MED ORDER — TECHNETIUM TC 99M TETROFOSMIN IV KIT
10.0000 | PACK | Freq: Once | INTRAVENOUS | Status: AC | PRN
  Administered 2016-05-08: 10 via INTRAVENOUS

## 2016-05-08 MED ORDER — REGADENOSON 0.4 MG/5ML IV SOLN
INTRAVENOUS | Status: AC
  Filled 2016-05-08: qty 5

## 2016-05-08 MED ORDER — REGADENOSON 0.4 MG/5ML IV SOLN
0.4000 mg | Freq: Once | INTRAVENOUS | Status: AC
  Administered 2016-05-08: 0.4 mg via INTRAVENOUS

## 2016-05-08 NOTE — Progress Notes (Signed)
1 day lexiscan myoview completed without significant complication. Pending result by [facility] reader  Signed, Almyra Deforest PA Pager: [phone]

## 2016-05-08 NOTE — Progress Notes (Signed)
Patient is discharge to home accompanied by patient's son  and NT via wheelchair. Discharge instructions given . Patient verbalizes understanding. All personal belongings given. Telemetry box and IV removed prior to discharge and site in good condition.

## 2016-05-08 NOTE — Progress Notes (Signed)
Myoview low risk study with normal perfusion. EF of 69%

## 2016-05-10 NOTE — Discharge Summary (Signed)
Physician Discharge Summary  Gabriela Jefferson:[id number] DOB: 18-Apr-1932 DOA: 05/06/2016  PCP: Gwen Pounds, MD  Admit date: 05/06/2016 Discharge date: 05/08/2016  Admitted From: (Home,) Disposition:  (Home )  Recommendations for Outpatient Follow-up:  1. Follow up with PCP in 1-2 weeks 2. Please obtain BMP/CBC in one week 3. Please follow up  With gastroenterology as recommended.     Discharge Condition:stable CODE STATUS:full code.  Diet recommendation: Heart Healthy  Brief/Interim Summary: 82 year old admitted for chest pain.   Discharge Diagnoses:  Active Problems:   Chest pain   Dysphagia   Reflux  Atypical chest pain: - admitted to telemetry. Serial troponins negative.  Echo unremarkable. Good LVEF , grade 1 diastolic dysfunction.  EKG unremarkable.  Stress test  Is low risk.  Probably atypical musculoskeletal vs GERD.   Hypertension: well controlled.  Diabetes Mellitus: hgba1c is 7.6 Resume SSI.   Dysphagia to solids ,  Barium esophagogram showed non specific esophageal motility disorder and no evidence of esophageal stricture.  PPI.  Marland Kitchen   Discharge Instructions  Discharge Instructions    Diet - low sodium heart healthy    Complete by:  As directed   Discharge instructions    Complete by:  As directed   Please follow up with PCP in one week and gastroenterology as needed.       Medication List    STOP taking these medications   naproxen 500 MG tablet Commonly known as:  NAPROSYN     TAKE these medications   cholecalciferol 1000 units tablet Commonly known as:  VITAMIN D Take 2,000 Units by mouth daily.   glyBURIDE 5 MG tablet Commonly known as:  DIABETA Take 5 mg by mouth daily with breakfast.   hydrochlorothiazide 12.5 MG capsule Commonly known as:  MICROZIDE Take 12.5 mg by mouth daily as needed (for edema).   irbesartan 150 MG tablet Commonly known as:  AVAPRO Take 150 mg by mouth daily.   levothyroxine 125 MCG tablet Commonly  known as:  SYNTHROID, LEVOTHROID Take 125 mcg by mouth daily before breakfast.   metFORMIN 500 MG tablet Commonly known as:  GLUCOPHAGE Take 500 mg by mouth 2 (two) times daily with a meal.   pantoprazole 40 MG tablet Commonly known as:  PROTONIX Take 1 tablet (40 mg total) by mouth daily.   rosuvastatin 20 MG tablet Commonly known as:  CRESTOR Take 20 mg by mouth every evening.   vitamin B-12 100 MCG tablet Commonly known as:  CYANOCOBALAMIN Take 50 mcg by mouth daily.      Follow-up Information    Gwen Pounds, MD. Schedule an appointment as soon as possible for a visit in 1 week.   Specialty:  Internal Medicine Contact information: [address] [place] [address] [phone]          Allergies  Allergen Reactions  . Oxycodone Nausea And Vomiting    Consultations:  none   Procedures/Studies: Dg Chest 2 View  Result Date: 05/06/2016 CLINICAL DATA:  Midsternal chest pain radiates posteriorly. Shortness of breath. EXAM: CHEST  2 VIEW COMPARISON:  04/22/2011. FINDINGS: Hyperexpansion is consistent with emphysema. Nodular density right lower lung is unchanged. The cardiopericardial silhouette is within normal limits for size. Thoracolumbar scoliosis is stable in the interval. The visualized bony structures of the thorax are intact. IMPRESSION: Stable.  Emphysema without acute findings. Electronically Signed   By: Kennith Center M.D.   On: 05/06/2016 17:05   Dg Esophagus  Result Date: 05/07/2016 CLINICAL  DATA:  Dysphagia and reflux. EXAM: ESOPHOGRAM/BARIUM SWALLOW TECHNIQUE: Single contrast examination was performed using  thin barium. FLUOROSCOPY TIME:  Radiation Exposure Index (as provided by the fluoroscopic device): If the device does not provide the exposure index: Fluoroscopy Time:  1 minutes and 12 seconds. Number of Acquired Images: COMPARISON:  None. FINDINGS: No evidence for gross esophageal mass lesion. No evidence for mucosal ulceration or esophageal  diverticulum. Assessment of esophageal motility demonstrates disruption of primary peristalsis on all swallows. 13 mm barium tablet passes readily into the stomach when taken with water. IMPRESSION: Nonspecific esophageal motility disorder. No evidence for esophageal stricture. Electronically Signed   By: Kennith Center M.D.   On: 05/07/2016 17:41   Nm Myocar Multi W/spect W/wall Motion / Ef  Result Date: 05/08/2016  No T wave inversion was noted during stress.  There was no ST segment deviation noted during stress.  The study is normal.  This is a low risk study.  Nuclear stress EF: 69%.  Low risk stress nuclear study with normal perfusion and normal left ventricular regional and global systolic function.       Subjective: No new complaints.   Discharge Exam: Vitals:   05/08/16 0948 05/08/16 1131  BP: (!) 143/48 (!) 124/46  Pulse: 77 65  Resp:  16  Temp:  98.1 F (36.7 C)   Vitals:   05/08/16 0944 05/08/16 0946 05/08/16 0948 05/08/16 1131  BP: (!) 147/65 (!) 141/65 (!) 143/48 (!) 124/46  Pulse: 85 82 77 65  Resp:    16  Temp:    98.1 F (36.7 C)  TempSrc:    Oral  SpO2:    98%  Weight:      Height:        General: Pt is alert, awake, not in acute distress Cardiovascular: RRR, S1/S2 +, no rubs, no gallops Respiratory: CTA bilaterally, no wheezing, no rhonchi Abdominal: Soft, NT, ND, bowel sounds + Extremities: no edema, no cyanosis    The results of significant diagnostics from this hospitalization (including imaging, microbiology, ancillary and laboratory) are listed below for reference.     Microbiology: No results found for this or any previous visit (from the past 240 hour(s)).   Labs: BNP (last 3 results) No results for input(s): BNP in the last 8760 hours. Basic Metabolic Panel:  Recent Labs Lab 05/06/16 1543  NA 137  K 4.4  CL 106  CO2 23  GLUCOSE 137*  BUN 13  CREATININE 1.11*  CALCIUM 9.1   Liver Function Tests: No results for input(s):  AST, ALT, ALKPHOS, BILITOT, PROT, ALBUMIN in the last 168 hours. No results for input(s): LIPASE, AMYLASE in the last 168 hours. No results for input(s): AMMONIA in the last 168 hours. CBC:  Recent Labs Lab 05/06/16 1543  WBC 9.1  HGB 11.9*  HCT 37.4  MCV 89.5  PLT 284   Cardiac Enzymes:  Recent Labs Lab 05/06/16 1814 05/06/16 1936 05/07/16 0207 05/07/16 0720  TROPONINI <0.03 <0.03 <0.03 <0.03   BNP: Invalid input(s): POCBNP CBG:  Recent Labs Lab 05/07/16 1129 05/07/16 1625 05/07/16 2138 05/08/16 0702 05/08/16 1130  GLUCAP 161* 123* 163* 145* 169*   D-Dimer No results for input(s): DDIMER in the last 72 hours. Hgb A1c No results for input(s): HGBA1C in the last 72 hours. Lipid Profile No results for input(s): CHOL, HDL, LDLCALC, TRIG, CHOLHDL, LDLDIRECT in the last 72 hours. Thyroid function studies No results for input(s): TSH, T4TOTAL, T3FREE, THYROIDAB in the last 72 hours.  Invalid input(s): FREET3 Anemia work up No results for input(s): VITAMINB12, FOLATE, FERRITIN, TIBC, IRON, RETICCTPCT in the last 72 hours. Urinalysis No results found for: COLORURINE, APPEARANCEUR, LABSPEC, PHURINE, GLUCOSEU, HGBUR, BILIRUBINUR, KETONESUR, PROTEINUR, UROBILINOGEN, NITRITE, LEUKOCYTESUR Sepsis Labs Invalid input(s): PROCALCITONIN,  WBC,  LACTICIDVEN Microbiology No results found for this or any previous visit (from the past 240 hour(s)).   Time coordinating discharge: Over 30 minutes  SIGNED:   Kathlen Mody, MD  [facility] 05/10/2016, 3:38 PM Pager   If 7PM-7AM, please contact night-coverage [email or web address] Password [email or web address]

## 2016-05-11 NOTE — Progress Notes (Signed)
Agree with Ms. Zehr's management.  Kimmarie Pascale E. Lizmarie Witters, MD, FACG  

## 2016-05-18 DIAGNOSIS — E538 Deficiency of other specified B group vitamins: Secondary | ICD-10-CM | POA: Diagnosis not present

## 2016-05-18 DIAGNOSIS — E038 Other specified hypothyroidism: Secondary | ICD-10-CM | POA: Diagnosis not present

## 2016-05-21 DIAGNOSIS — Z803 Family history of malignant neoplasm of breast: Secondary | ICD-10-CM | POA: Diagnosis not present

## 2016-05-21 DIAGNOSIS — Z1231 Encounter for screening mammogram for malignant neoplasm of breast: Secondary | ICD-10-CM | POA: Diagnosis not present

## 2016-06-02 DIAGNOSIS — H2513 Age-related nuclear cataract, bilateral: Secondary | ICD-10-CM | POA: Diagnosis not present

## 2016-06-02 DIAGNOSIS — H524 Presbyopia: Secondary | ICD-10-CM | POA: Diagnosis not present

## 2016-06-06 DIAGNOSIS — E1140 Type 2 diabetes mellitus with diabetic neuropathy, unspecified: Secondary | ICD-10-CM | POA: Diagnosis not present

## 2016-06-06 DIAGNOSIS — I10 Essential (primary) hypertension: Secondary | ICD-10-CM | POA: Diagnosis not present

## 2016-06-06 DIAGNOSIS — J438 Other emphysema: Secondary | ICD-10-CM | POA: Diagnosis not present

## 2016-06-06 DIAGNOSIS — E538 Deficiency of other specified B group vitamins: Secondary | ICD-10-CM | POA: Diagnosis not present

## 2016-06-06 DIAGNOSIS — K5909 Other constipation: Secondary | ICD-10-CM | POA: Diagnosis not present

## 2016-06-06 DIAGNOSIS — R9431 Abnormal electrocardiogram [ECG] [EKG]: Secondary | ICD-10-CM | POA: Diagnosis not present

## 2016-06-06 DIAGNOSIS — Z6830 Body mass index (BMI) 30.0-30.9, adult: Secondary | ICD-10-CM | POA: Diagnosis not present

## 2016-06-06 DIAGNOSIS — K219 Gastro-esophageal reflux disease without esophagitis: Secondary | ICD-10-CM | POA: Diagnosis not present

## 2016-06-06 DIAGNOSIS — R0789 Other chest pain: Secondary | ICD-10-CM | POA: Diagnosis not present

## 2016-06-06 DIAGNOSIS — E784 Other hyperlipidemia: Secondary | ICD-10-CM | POA: Diagnosis not present

## 2016-06-06 DIAGNOSIS — E038 Other specified hypothyroidism: Secondary | ICD-10-CM | POA: Diagnosis not present

## 2016-06-17 NOTE — Progress Notes (Signed)
She had non-specific motility d/o on ba esophagram  Was admitted and dced after this visit  Gatha Mayer, MD, [facility]

## 2016-07-02 DIAGNOSIS — E1351 Other specified diabetes mellitus with diabetic peripheral angiopathy without gangrene: Secondary | ICD-10-CM | POA: Diagnosis not present

## 2016-07-02 DIAGNOSIS — L84 Corns and callosities: Secondary | ICD-10-CM | POA: Diagnosis not present

## 2016-07-02 DIAGNOSIS — L602 Onychogryphosis: Secondary | ICD-10-CM | POA: Diagnosis not present

## 2016-07-02 DIAGNOSIS — I70293 Other atherosclerosis of native arteries of extremities, bilateral legs: Secondary | ICD-10-CM | POA: Diagnosis not present

## 2016-07-07 DIAGNOSIS — E559 Vitamin D deficiency, unspecified: Secondary | ICD-10-CM | POA: Diagnosis not present

## 2016-07-07 DIAGNOSIS — M810 Age-related osteoporosis without current pathological fracture: Secondary | ICD-10-CM | POA: Diagnosis not present

## 2016-09-25 DIAGNOSIS — M810 Age-related osteoporosis without current pathological fracture: Secondary | ICD-10-CM | POA: Diagnosis not present

## 2016-09-25 DIAGNOSIS — K5909 Other constipation: Secondary | ICD-10-CM | POA: Diagnosis not present

## 2016-09-25 DIAGNOSIS — M5489 Other dorsalgia: Secondary | ICD-10-CM | POA: Diagnosis not present

## 2016-09-25 DIAGNOSIS — M1990 Unspecified osteoarthritis, unspecified site: Secondary | ICD-10-CM | POA: Diagnosis not present

## 2016-09-25 DIAGNOSIS — Z6830 Body mass index (BMI) 30.0-30.9, adult: Secondary | ICD-10-CM | POA: Diagnosis not present

## 2016-09-25 DIAGNOSIS — E038 Other specified hypothyroidism: Secondary | ICD-10-CM | POA: Diagnosis not present

## 2016-09-25 DIAGNOSIS — Z23 Encounter for immunization: Secondary | ICD-10-CM | POA: Diagnosis not present

## 2016-09-25 DIAGNOSIS — R0789 Other chest pain: Secondary | ICD-10-CM | POA: Diagnosis not present

## 2016-09-25 DIAGNOSIS — E1140 Type 2 diabetes mellitus with diabetic neuropathy, unspecified: Secondary | ICD-10-CM | POA: Diagnosis not present

## 2016-09-25 DIAGNOSIS — E668 Other obesity: Secondary | ICD-10-CM | POA: Diagnosis not present

## 2016-09-25 DIAGNOSIS — E1149 Type 2 diabetes mellitus with other diabetic neurological complication: Secondary | ICD-10-CM | POA: Diagnosis not present

## 2016-09-25 DIAGNOSIS — I10 Essential (primary) hypertension: Secondary | ICD-10-CM | POA: Diagnosis not present

## 2016-10-07 DIAGNOSIS — L602 Onychogryphosis: Secondary | ICD-10-CM | POA: Diagnosis not present

## 2016-10-07 DIAGNOSIS — L84 Corns and callosities: Secondary | ICD-10-CM | POA: Diagnosis not present

## 2016-10-07 DIAGNOSIS — E1351 Other specified diabetes mellitus with diabetic peripheral angiopathy without gangrene: Secondary | ICD-10-CM | POA: Diagnosis not present

## 2017-02-17 ENCOUNTER — Other Ambulatory Visit: Payer: Self-pay | Admitting: Internal Medicine

## 2017-02-17 DIAGNOSIS — I739 Peripheral vascular disease, unspecified: Secondary | ICD-10-CM

## 2017-02-24 ENCOUNTER — Other Ambulatory Visit: Payer: Self-pay | Admitting: Internal Medicine

## 2017-02-24 ENCOUNTER — Ambulatory Visit
Admission: RE | Admit: 2017-02-24 | Discharge: 2017-02-24 | Disposition: A | Discharge status: Home / Self Care | Payer: Medicare Other | Source: Ambulatory Visit | Attending: Internal Medicine | Admitting: Internal Medicine

## 2017-02-24 DIAGNOSIS — I739 Peripheral vascular disease, unspecified: Secondary | ICD-10-CM

## 2017-09-21 ENCOUNTER — Emergency Department (HOSPITAL_COMMUNITY): Payer: Medicare Other

## 2017-09-21 ENCOUNTER — Other Ambulatory Visit: Payer: Self-pay

## 2017-09-21 ENCOUNTER — Emergency Department (HOSPITAL_COMMUNITY)
Admission: EM | Admit: 2017-09-21 | Discharge: 2017-09-21 | Disposition: A | Discharge status: Home / Self Care | Payer: Medicare Other | Attending: Emergency Medicine | Admitting: Emergency Medicine

## 2017-09-21 ENCOUNTER — Encounter (HOSPITAL_COMMUNITY): Payer: Self-pay | Admitting: *Deleted

## 2017-09-21 DIAGNOSIS — R0602 Shortness of breath: Secondary | ICD-10-CM | POA: Diagnosis not present

## 2017-09-21 DIAGNOSIS — E039 Hypothyroidism, unspecified: Secondary | ICD-10-CM | POA: Diagnosis not present

## 2017-09-21 DIAGNOSIS — R51 Headache: Secondary | ICD-10-CM | POA: Insufficient documentation

## 2017-09-21 DIAGNOSIS — Z79899 Other long term (current) drug therapy: Secondary | ICD-10-CM | POA: Diagnosis not present

## 2017-09-21 DIAGNOSIS — Z7984 Long term (current) use of oral hypoglycemic drugs: Secondary | ICD-10-CM | POA: Diagnosis not present

## 2017-09-21 DIAGNOSIS — E1140 Type 2 diabetes mellitus with diabetic neuropathy, unspecified: Secondary | ICD-10-CM | POA: Diagnosis not present

## 2017-09-21 DIAGNOSIS — R0789 Other chest pain: Secondary | ICD-10-CM | POA: Insufficient documentation

## 2017-09-21 DIAGNOSIS — R42 Dizziness and giddiness: Secondary | ICD-10-CM | POA: Diagnosis not present

## 2017-09-21 DIAGNOSIS — I10 Essential (primary) hypertension: Secondary | ICD-10-CM | POA: Insufficient documentation

## 2017-09-21 LAB — BASIC METABOLIC PANEL
ANION GAP: 11 (ref 5–15)
BUN: 14 mg/dL (ref 6–20)
CHLORIDE: 104 mmol/L (ref 101–111)
CO2: 24 mmol/L (ref 22–32)
Calcium: 9 mg/dL (ref 8.9–10.3)
Creatinine, Ser: 1.23 mg/dL — ABNORMAL HIGH (ref 0.44–1.00)
GFR calc Af Amer: 46 mL/min — ABNORMAL LOW (ref >60)
GFR, EST NON AFRICAN AMERICAN: 40 mL/min — AB (ref >60)
Glucose, Bld: 121 mg/dL — ABNORMAL HIGH (ref 65–99)
POTASSIUM: 4 mmol/L (ref 3.5–5.1)
SODIUM: 139 mmol/L (ref 135–145)

## 2017-09-21 LAB — CBC
HEMATOCRIT: 35.3 % — AB (ref 36.0–46.0)
Hemoglobin: 11.5 g/dL — ABNORMAL LOW (ref 12.0–15.0)
MCH: 29.3 pg (ref 26.0–34.0)
MCHC: 32.6 g/dL (ref 30.0–36.0)
MCV: 90.1 fL (ref 78.0–100.0)
Platelets: 273 10*3/uL (ref 150–400)
RBC: 3.92 MIL/uL (ref 3.87–5.11)
RDW: 12.8 % (ref 11.5–15.5)
WBC: 5 10*3/uL (ref 4.0–10.5)

## 2017-09-21 LAB — D-DIMER, QUANTITATIVE: D-Dimer, Quant: 0.66 ug/mL-FEU — ABNORMAL HIGH (ref 0.00–0.50)

## 2017-09-21 LAB — I-STAT TROPONIN, ED
Troponin i, poc: 0 ng/mL (ref 0.00–0.08)
Troponin i, poc: 0 ng/mL (ref 0.00–0.08)

## 2017-09-21 MED ORDER — ASPIRIN 81 MG PO CHEW
324.0000 mg | CHEWABLE_TABLET | Freq: Once | ORAL | Status: AC
  Administered 2017-09-21: 324 mg via ORAL
  Filled 2017-09-21: qty 4

## 2017-09-21 MED ORDER — PANTOPRAZOLE SODIUM 40 MG PO TBEC: 40.0000 mg | DELAYED_RELEASE_TABLET | Freq: Every day | ORAL | 0 refills | Status: DC

## 2017-09-21 MED ORDER — GI COCKTAIL ~~LOC~~
30.0000 mL | Freq: Once | ORAL | Status: AC
  Administered 2017-09-21: 30 mL via ORAL
  Filled 2017-09-21: qty 30

## 2017-09-21 MED ORDER — SODIUM CHLORIDE 0.9 % IV BOLUS (SEPSIS)
1000.0000 mL | Freq: Once | INTRAVENOUS | Status: AC
  Administered 2017-09-21: 1000 mL via INTRAVENOUS

## 2017-09-21 NOTE — ED Triage Notes (Signed)
Pt reports mid chest heaviness since last night. Woke up this am with dizziness and unsteady gait. Hx of vertigo. No acute distress is noted at triage.

## 2017-09-21 NOTE — ED Notes (Signed)
Patient transported to CT 

## 2017-09-21 NOTE — ED Provider Notes (Signed)
[facility] EMERGENCY DEPARTMENT Provider Note   CSN: [id number] Arrival date & time: 09/21/17  [id number]     History   Chief Complaint Chief Complaint  Patient presents with  . Chest Pain  . Dizziness    HPI Gabriela Jefferson is a 83 y.o. female.  HPI  83 year old female presents with a chief complaint of chest pain and dizziness.  Both of these started 2 days ago and have been constant since starting.  She has an associated frontal headache that she describes as a 5/10 and a nagging sensation.  It has been constant since starting 2 days ago as well.  The chest pain feels like pressure just left of her sternum.  Sometimes when she eats the pain will worsen and feels like something is stuck in the middle of her chest.  There is no nausea or vomiting.  Taking a deep breath seems to make the pain a little bit worse like there is something stuck in there.  Some mild shortness of breath.  None of the symptoms are exertional.  The dizziness feels like her head is "floating".  Sometimes she feels off balance but has not fallen.  She denies any focal weakness or numbness.  Feels somewhat like when she had vertigo and has had vertigo multiple times.  She denies any neck pain or stiffness, fevers.  She has chronic on and off leg swelling, left worse than right.  No new or worsening leg swelling.  She has not tried anything for the chest symptoms or the headache. Chest pain does not radiate.  Past Medical History:  Diagnosis Date  . Adenomatous colon polyp   . Arthritis   . Diabetes mellitus    type 2  . GERD (gastroesophageal reflux disease)   . Hyperlipidemia   . Hypertension   . Hypothyroidism   . Insomnia   . Neuropathy   . Osteoporosis   . Plantar fasciitis   . Vertigo   . Vitamin B12 deficiency   . Vitamin D deficiency   . Wears dentures    upper  . Wears glasses     Patient Active Problem List   Diagnosis Date Noted  . Dysphagia 05/07/2016  . Reflux 05/07/2016   . Constipation 05/07/2016  . Chest pain 05/06/2016  . RLQ PAIN 08/08/2009  . CONSTIPATION 02/04/2009  . Type I diabetes mellitus with complication, uncontrolled ([place]) 12/26/2008  . GASTROPARESIS 12/19/2008  . DYSPHAGIA 12/19/2008    Past Surgical History:  Procedure Laterality Date  . ABDOMINAL HYSTERECTOMY    . COLONOSCOPY    . CORRECTION HAMMER TOE     left foot  . CYSTOCELE REPAIR    . HAND SURGERY    . MASS EXCISION Right 06/13/2013   Procedure: EXCISION MASS RIGHT LONG FINGER;  Surgeon: Cammie Sickle., MD;  Location: [facility];  Service: Orthopedics;  Laterality: Right;  . RECTOCELE REPAIR    . SHOULDER ARTHROSCOPY  2008   left rotator cuff  . STERIOD INJECTION  07/12/2012   Procedure: STEROID INJECTION;  Surgeon: Cammie Sickle., MD;  Location: [facility];  Service: Orthopedics;  Laterality: Right;  right thumbcarpal-metacarpal joint  . TOE SURGERY Left   . TRIGGER FINGER RELEASE  2005   lt ring  . TRIGGER FINGER RELEASE  07/12/2012   Procedure: RELEASE TRIGGER FINGER/A-1 PULLEY;  Surgeon: Cammie Sickle., MD;  Location: [facility];  Service: Orthopedics;  Laterality: Right;  right long   . TUBAL LIGATION      OB History    No data available       Home Medications    Prior to Admission medications   Medication Sig Start Date End Date Taking? Authorizing Provider  cholecalciferol (VITAMIN D) 1000 UNITS tablet Take 2,000 Units by mouth daily.   Yes [provider]  glimepiride (AMARYL) 2 MG tablet Take 2 mg by mouth daily with breakfast.   Yes [provider]  hydrochlorothiazide (MICROZIDE) 12.5 MG capsule Take 12.5 mg by mouth daily as needed (for edema).  02/04/16  Yes [provider]  irbesartan (AVAPRO) 150 MG tablet Take 150 mg by mouth daily.    Yes [provider]  levothyroxine (SYNTHROID, LEVOTHROID) 100 MCG tablet Take 100 mcg by mouth daily before breakfast.    Yes [provider]  Menthol, Topical Analgesic, (BIOFREEZE ROLL-ON EX) Apply 1 application topically as needed (shoulder pain).   Yes [provider]  metFORMIN (GLUCOPHAGE) 500 MG tablet Take 1,000 mg by mouth 2 (two) times daily with a meal.    Yes [provider]  rosuvastatin (CRESTOR) 10 MG tablet Take 10 mg by mouth daily.   Yes [provider]  vitamin B-12 (CYANOCOBALAMIN) 100 MCG tablet Take 50 mcg by mouth daily.   Yes [provider]  pantoprazole (PROTONIX) 40 MG tablet Take 1 tablet (40 mg total) by mouth daily. 09/21/17   Sherwood Gambler, MD    Family History Family History  Problem Relation Age of Onset  . COPD Father   . Dementia Mother   . Diabetes Mellitus II Mother   . Diabetes Mellitus II Sister   . Colon cancer Neg Hx     Social History Social History   Tobacco Use  . Smoking status: Never Smoker  . Smokeless tobacco: Never Used  Substance Use Topics  . Alcohol use: No    Alcohol/week: 0.0 oz  . Drug use: No     Allergies   Oxycodone   Review of Systems Review of Systems  Respiratory: Positive for shortness of breath.   Cardiovascular: Positive for chest pain.  Gastrointestinal: Negative for abdominal pain, nausea and vomiting.  Musculoskeletal: Negative for back pain and neck pain.  Neurological: Positive for dizziness and headaches. Negative for weakness and numbness.  All other systems reviewed and are negative.    Physical Exam Updated Vital Signs BP (!) 137/44   Pulse 65   Temp 98.8 F (37.1 C) (Oral)   Resp 13   SpO2 99%   Physical Exam  Constitutional: She is oriented to person, place, and time. She appears well-developed and well-nourished.  Non-toxic appearance. She does not appear ill. No distress.  HENT:  Head: Normocephalic and atraumatic.  Right Ear: External ear normal.  Left Ear: External ear normal.  Nose: Nose normal.  Eyes: EOM are normal. Pupils are equal, round, and  reactive to light. Right eye exhibits no discharge. Left eye exhibits no discharge. Right eye exhibits no nystagmus. Left eye exhibits no nystagmus.  Neck: Normal range of motion. Neck supple.  Cardiovascular: Normal rate, regular rhythm and normal heart sounds.  Pulmonary/Chest: Effort normal and breath sounds normal. She has no wheezes. She has no rales.  Abdominal: Soft. She exhibits no distension. There is no tenderness.  Neurological: She is alert and oriented to person, place, and time.  CN 3-12 grossly intact. 5/5 strength in all 4 extremities. Grossly normal sensation. Normal finger to nose.  Normal gait  Skin: Skin is warm and dry.  Nursing note and vitals reviewed.    ED Treatments / Results  Labs (all labs ordered are listed, but only abnormal results are displayed) Labs Reviewed  BASIC METABOLIC PANEL - Abnormal; Notable for the following components:      Result Value   Glucose, Bld 121 (*)    Creatinine, Ser 1.23 (*)    GFR calc non Af Amer 40 (*)    GFR calc Af Amer 46 (*)    All other components within normal limits  CBC - Abnormal; Notable for the following components:   Hemoglobin 11.5 (*)    HCT 35.3 (*)    All other components within normal limits  D-DIMER, QUANTITATIVE (NOT AT [facility]) - Abnormal; Notable for the following components:   D-Dimer, Quant 0.66 (*)    All other components within normal limits  I-STAT TROPONIN, ED  I-STAT TROPONIN, ED    EKG  EKG Interpretation  Date/Time:  Tuesday September 21 2017 09:55:18 EST Ventricular Rate:  83 PR Interval:  162 QRS Duration: 70 QT Interval:  340 QTC Calculation: 399 R Axis:   34 Text Interpretation:  Normal sinus rhythm no acute ST/T changes no significant change since July 2017 Confirmed by Sherwood Gambler [phone]) on 09/21/2017 3:41:26 PM       EKG Interpretation  Date/Time:  Tuesday September 21 2017 09:55:18 EST Ventricular Rate:  83 PR Interval:  162 QRS Duration: 70 QT Interval:  340 QTC  Calculation: 399 R Axis:   34 Text Interpretation:  Normal sinus rhythm no acute ST/T changes no significant change since July 2017 Confirmed by Sherwood Gambler [phone]) on 09/21/2017 3:41:26 PM       Radiology Dg Chest 2 View  Result Date: 09/21/2017 CLINICAL DATA:  Chest heaviness for the past 2 days associated with dizziness and shortness of breath. History of hyperlipidemia, hypertension, diabetes, nonsmoker. EXAM: CHEST  2 VIEW COMPARISON:  PA and lateral chest x-ray of September 06, 2016 FINDINGS: The lungs are mildly hyperinflated. There is no focal infiltrate. The interstitial markings are coarse but stable. The heart and pulmonary vascularity are normal. There is marked dextrocurvature centered in the upper thoracic spine which is stable. IMPRESSION: Chronic bronchitic changes, stable. No pneumonia, CHF, nor other acute cardiopulmonary abnormality. Electronically Signed   By: David  Martinique M.D.   On: 09/21/2017 10:29   Ct Head Wo Contrast  Result Date: 09/21/2017 CLINICAL DATA:  Dizziness. EXAM: CT HEAD WITHOUT CONTRAST TECHNIQUE: Contiguous axial images were obtained from the base of the skull through the vertex without intravenous contrast. COMPARISON:  None. FINDINGS: Brain: Mild chronic ischemic white matter disease is noted. No mass effect or midline shift is noted. Ventricular size is within normal limits. There is no evidence of mass lesion, hemorrhage or acute infarction. Vascular: No hyperdense vessel or unexpected calcification. Skull: Normal. Negative for fracture or focal lesion. Sinuses/Orbits: No acute finding. Other: None. IMPRESSION: Mild chronic ischemic white matter disease. No acute intracranial abnormality seen. Electronically Signed   By: Marijo Conception, M.D.   On: 09/21/2017 17:43    Procedures Procedures (including critical care time)  Medications Ordered in ED Medications  sodium chloride 0.9 % bolus 1,000 mL (0 mLs Intravenous Stopped 09/21/17 1812)  aspirin  chewable tablet 324 mg (324 mg Oral Given 09/21/17 1643)  gi cocktail (Maalox,Lidocaine,Donnatal) (30 mLs Oral Given 09/21/17 1643)     Initial Impression / Assessment and Plan / ED Course  I  have reviewed the triage vital signs and the nursing notes.  Pertinent labs & imaging results that were available during my care of the patient were reviewed by me and considered in my medical decision making (see chart for details).     Patient chest pain resolved with GI cocktail.  While she does have some risk factors including diabetes and age my suspicion this is ACS is low.  It has been a continuous pain for a couple days now with negative troponins and negative ECG.  This is probably more GI given the relation to food.  However I did discuss that while this is probably GI there is a chance she could have coronary disease and needs to follow-up closely with her PCP and/or return of her symptoms were to worsen.  She understands this.  Low risk presentation for PE with a negative age-adjusted d-dimer.  As for her dizziness, no clear etiology but this is improving with fluids and her neuro exam and ambulation are benign.  Doubt stroke.  Discharge home with return precautions.  Final Clinical Impressions(s) / ED Diagnoses   Final diagnoses:  Dizziness  Atypical chest pain    ED Discharge Orders        Ordered    pantoprazole (PROTONIX) 40 MG tablet  Daily     09/21/17 1755       Sherwood Gambler, MD 09/21/17 1835

## 2018-01-24 ENCOUNTER — Emergency Department (HOSPITAL_COMMUNITY): Payer: Medicare Other

## 2018-01-24 ENCOUNTER — Encounter (HOSPITAL_COMMUNITY): Payer: Self-pay | Admitting: Emergency Medicine

## 2018-01-24 ENCOUNTER — Other Ambulatory Visit: Payer: Self-pay

## 2018-01-24 ENCOUNTER — Inpatient Hospital Stay (HOSPITAL_COMMUNITY)
Admission: EM | Admit: 2018-01-24 | Discharge: 2018-01-28 | DRG: 964 | Disposition: A | Discharge status: Skilled Nursing Facility | Payer: Medicare Other | Attending: Surgery | Admitting: Surgery

## 2018-01-24 DIAGNOSIS — Z79899 Other long term (current) drug therapy: Secondary | ICD-10-CM

## 2018-01-24 DIAGNOSIS — S8001XA Contusion of right knee, initial encounter: Secondary | ICD-10-CM | POA: Diagnosis present

## 2018-01-24 DIAGNOSIS — K219 Gastro-esophageal reflux disease without esophagitis: Secondary | ICD-10-CM | POA: Diagnosis present

## 2018-01-24 DIAGNOSIS — I10 Essential (primary) hypertension: Secondary | ICD-10-CM | POA: Diagnosis present

## 2018-01-24 DIAGNOSIS — S20219A Contusion of unspecified front wall of thorax, initial encounter: Secondary | ICD-10-CM | POA: Diagnosis present

## 2018-01-24 DIAGNOSIS — R079 Chest pain, unspecified: Secondary | ICD-10-CM | POA: Diagnosis not present

## 2018-01-24 DIAGNOSIS — M1990 Unspecified osteoarthritis, unspecified site: Secondary | ICD-10-CM | POA: Diagnosis present

## 2018-01-24 DIAGNOSIS — S8002XA Contusion of left knee, initial encounter: Secondary | ICD-10-CM | POA: Diagnosis present

## 2018-01-24 DIAGNOSIS — Z885 Allergy status to narcotic agent status: Secondary | ICD-10-CM | POA: Diagnosis not present

## 2018-01-24 DIAGNOSIS — D62 Acute posthemorrhagic anemia: Secondary | ICD-10-CM | POA: Diagnosis not present

## 2018-01-24 DIAGNOSIS — E559 Vitamin D deficiency, unspecified: Secondary | ICD-10-CM | POA: Diagnosis present

## 2018-01-24 DIAGNOSIS — Y92410 Unspecified street and highway as the place of occurrence of the external cause: Secondary | ICD-10-CM

## 2018-01-24 DIAGNOSIS — E039 Hypothyroidism, unspecified: Secondary | ICD-10-CM | POA: Diagnosis present

## 2018-01-24 DIAGNOSIS — S2220XA Unspecified fracture of sternum, initial encounter for closed fracture: Secondary | ICD-10-CM | POA: Diagnosis present

## 2018-01-24 DIAGNOSIS — S2243XA Multiple fractures of ribs, bilateral, initial encounter for closed fracture: Secondary | ICD-10-CM | POA: Diagnosis present

## 2018-01-24 DIAGNOSIS — S065X9A Traumatic subdural hemorrhage with loss of consciousness of unspecified duration, initial encounter: Principal | ICD-10-CM | POA: Diagnosis present

## 2018-01-24 DIAGNOSIS — E538 Deficiency of other specified B group vitamins: Secondary | ICD-10-CM | POA: Diagnosis present

## 2018-01-24 DIAGNOSIS — M810 Age-related osteoporosis without current pathological fracture: Secondary | ICD-10-CM | POA: Diagnosis present

## 2018-01-24 DIAGNOSIS — S0083XA Contusion of other part of head, initial encounter: Secondary | ICD-10-CM | POA: Diagnosis present

## 2018-01-24 DIAGNOSIS — Z794 Long term (current) use of insulin: Secondary | ICD-10-CM

## 2018-01-24 DIAGNOSIS — E1040 Type 1 diabetes mellitus with diabetic neuropathy, unspecified: Secondary | ICD-10-CM | POA: Diagnosis present

## 2018-01-24 DIAGNOSIS — S80212A Abrasion, left knee, initial encounter: Secondary | ICD-10-CM | POA: Diagnosis present

## 2018-01-24 DIAGNOSIS — E785 Hyperlipidemia, unspecified: Secondary | ICD-10-CM | POA: Diagnosis present

## 2018-01-24 DIAGNOSIS — S065XAA Traumatic subdural hemorrhage with loss of consciousness status unknown, initial encounter: Secondary | ICD-10-CM | POA: Diagnosis present

## 2018-01-24 DIAGNOSIS — R402413 Glasgow coma scale score 13-15, at hospital admission: Secondary | ICD-10-CM | POA: Diagnosis present

## 2018-01-24 DIAGNOSIS — S27322A Contusion of lung, bilateral, initial encounter: Secondary | ICD-10-CM | POA: Diagnosis present

## 2018-01-24 LAB — BASIC METABOLIC PANEL
ANION GAP: 13 (ref 5–15)
BUN: 18 mg/dL (ref 6–20)
CALCIUM: 8.9 mg/dL (ref 8.9–10.3)
CO2: 19 mmol/L — ABNORMAL LOW (ref 22–32)
CREATININE: 1.18 mg/dL — AB (ref 0.44–1.00)
Chloride: 106 mmol/L (ref 101–111)
GFR, EST AFRICAN AMERICAN: 48 mL/min — AB (ref >60)
GFR, EST NON AFRICAN AMERICAN: 41 mL/min — AB (ref >60)
Glucose, Bld: 231 mg/dL — ABNORMAL HIGH (ref 65–99)
Potassium: 3.9 mmol/L (ref 3.5–5.1)
SODIUM: 138 mmol/L (ref 135–145)

## 2018-01-24 LAB — CBC WITH DIFFERENTIAL/PLATELET
BASOS ABS: 0 10*3/uL (ref 0.0–0.1)
BASOS PCT: 0 %
EOS ABS: 0 10*3/uL (ref 0.0–0.7)
Eosinophils Relative: 0 %
HEMATOCRIT: 35.4 % — AB (ref 36.0–46.0)
HEMOGLOBIN: 11.3 g/dL — AB (ref 12.0–15.0)
Lymphocytes Relative: 11 %
Lymphs Abs: 1.3 10*3/uL (ref 0.7–4.0)
MCH: 29.1 pg (ref 26.0–34.0)
MCHC: 31.9 g/dL (ref 30.0–36.0)
MCV: 91.2 fL (ref 78.0–100.0)
Monocytes Absolute: 0.5 10*3/uL (ref 0.1–1.0)
Monocytes Relative: 4 %
NEUTROS ABS: 10.4 10*3/uL — AB (ref 1.7–7.7)
NEUTROS PCT: 85 %
Platelets: 212 10*3/uL (ref 150–400)
RBC: 3.88 MIL/uL (ref 3.87–5.11)
RDW: 13.1 % (ref 11.5–15.5)
WBC: 12.2 10*3/uL — AB (ref 4.0–10.5)

## 2018-01-24 MED ORDER — IOPAMIDOL (ISOVUE-300) INJECTION 61%
INTRAVENOUS | Status: AC
  Filled 2018-01-24: qty 100

## 2018-01-24 MED ORDER — TRAMADOL HCL 50 MG PO TABS
50.0000 mg | ORAL_TABLET | Freq: Four times a day (QID) | ORAL | Status: DC | PRN
  Administered 2018-01-25 (×2): 50 mg via ORAL
  Filled 2018-01-24 (×3): qty 1

## 2018-01-24 MED ORDER — ALBUTEROL SULFATE (2.5 MG/3ML) 0.083% IN NEBU: 3.0000 mL | INHALATION_SOLUTION | RESPIRATORY_TRACT | Status: DC | PRN

## 2018-01-24 MED ORDER — MORPHINE SULFATE (PF) 4 MG/ML IV SOLN
1.0000 mg | INTRAVENOUS | Status: DC | PRN
  Administered 2018-01-25: 1 mg via INTRAVENOUS
  Filled 2018-01-24: qty 1

## 2018-01-24 MED ORDER — MORPHINE SULFATE (PF) 4 MG/ML IV SOLN
4.0000 mg | Freq: Once | INTRAVENOUS | Status: AC
  Administered 2018-01-24: 4 mg via INTRAVENOUS
  Filled 2018-01-24: qty 1

## 2018-01-24 MED ORDER — LEVOTHYROXINE SODIUM 100 MCG PO TABS
100.0000 ug | ORAL_TABLET | Freq: Every day | ORAL | Status: DC
  Administered 2018-01-25 – 2018-01-28 (×4): 100 ug via ORAL
  Filled 2018-01-24 (×4): qty 1

## 2018-01-24 MED ORDER — IRBESARTAN 150 MG PO TABS
150.0000 mg | ORAL_TABLET | Freq: Every day | ORAL | Status: DC
  Administered 2018-01-25 – 2018-01-28 (×4): 150 mg via ORAL
  Filled 2018-01-24 (×4): qty 1

## 2018-01-24 MED ORDER — IOPAMIDOL (ISOVUE-300) INJECTION 61%
75.0000 mL | Freq: Once | INTRAVENOUS | Status: AC | PRN
  Administered 2018-01-24: 100 mL via INTRAVENOUS

## 2018-01-24 MED ORDER — MORPHINE SULFATE (PF) 4 MG/ML IV SOLN
2.0000 mg | Freq: Once | INTRAVENOUS | Status: AC
  Administered 2018-01-24: 2 mg via INTRAVENOUS
  Filled 2018-01-24: qty 1

## 2018-01-24 MED ORDER — DEXTROSE-NACL 5-0.9 % IV SOLN
INTRAVENOUS | Status: DC
  Administered 2018-01-25 (×2): via INTRAVENOUS

## 2018-01-24 MED ORDER — PANTOPRAZOLE SODIUM 40 MG PO TBEC
40.0000 mg | DELAYED_RELEASE_TABLET | Freq: Every day | ORAL | Status: DC
  Administered 2018-01-25 – 2018-01-28 (×4): 40 mg via ORAL
  Filled 2018-01-24 (×4): qty 1

## 2018-01-24 MED ORDER — ONDANSETRON 4 MG PO TBDP: 4.0000 mg | ORAL_TABLET | Freq: Four times a day (QID) | ORAL | Status: DC | PRN

## 2018-01-24 MED ORDER — HYDROCHLOROTHIAZIDE 12.5 MG PO CAPS: 12.5000 mg | ORAL_CAPSULE | Freq: Every day | ORAL | Status: DC | PRN

## 2018-01-24 MED ORDER — INSULIN ASPART 100 UNIT/ML ~~LOC~~ SOLN
0.0000 [IU] | SUBCUTANEOUS | Status: DC
  Administered 2018-01-25 (×2): 5 [IU] via SUBCUTANEOUS
  Administered 2018-01-25: 3 [IU] via SUBCUTANEOUS
  Administered 2018-01-25: 5 [IU] via SUBCUTANEOUS
  Administered 2018-01-25: 3 [IU] via SUBCUTANEOUS
  Administered 2018-01-26: 2 [IU] via SUBCUTANEOUS
  Administered 2018-01-26: 3 [IU] via SUBCUTANEOUS

## 2018-01-24 MED ORDER — GLIMEPIRIDE 2 MG PO TABS
2.0000 mg | ORAL_TABLET | Freq: Every day | ORAL | Status: DC
  Administered 2018-01-25 – 2018-01-28 (×4): 2 mg via ORAL
  Filled 2018-01-24 (×4): qty 1

## 2018-01-24 MED ORDER — HYDRALAZINE HCL 20 MG/ML IJ SOLN: 10.0000 mg | INTRAMUSCULAR | Status: DC | PRN

## 2018-01-24 MED ORDER — ROSUVASTATIN CALCIUM 10 MG PO TABS
10.0000 mg | ORAL_TABLET | Freq: Every day | ORAL | Status: DC
  Administered 2018-01-25 – 2018-01-28 (×4): 10 mg via ORAL
  Filled 2018-01-24 (×4): qty 1

## 2018-01-24 MED ORDER — ONDANSETRON HCL 4 MG/2ML IJ SOLN
4.0000 mg | Freq: Four times a day (QID) | INTRAMUSCULAR | Status: DC | PRN
  Administered 2018-01-25: 4 mg via INTRAVENOUS
  Filled 2018-01-24: qty 2

## 2018-01-24 MED ORDER — ONDANSETRON HCL 4 MG/2ML IJ SOLN
4.0000 mg | Freq: Once | INTRAMUSCULAR | Status: AC
  Administered 2018-01-24: 4 mg via INTRAVENOUS
  Filled 2018-01-24: qty 2
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
## 2018-01-24 NOTE — ED Notes (Signed)
ED Provider at bedside. 

## 2018-01-24 NOTE — ED Notes (Signed)
Pt family took home clothes and watch. Pt only belonging are glasses which patient is wearing.
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
## 2018-01-24 NOTE — ED Notes (Signed)
Attempted to call report
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
## 2018-01-25 ENCOUNTER — Encounter (HOSPITAL_COMMUNITY): Payer: Self-pay

## 2018-01-25 ENCOUNTER — Inpatient Hospital Stay (HOSPITAL_COMMUNITY): Payer: Medicare Other

## 2018-01-25 ENCOUNTER — Other Ambulatory Visit: Payer: Self-pay

## 2018-01-25 LAB — GLUCOSE, CAPILLARY
GLUCOSE-CAPILLARY: 192 mg/dL — AB (ref 65–99)
GLUCOSE-CAPILLARY: 206 mg/dL — AB (ref 65–99)
Glucose-Capillary: 161 mg/dL — ABNORMAL HIGH (ref 65–99)
Glucose-Capillary: 164 mg/dL — ABNORMAL HIGH (ref 65–99)
Glucose-Capillary: 201 mg/dL — ABNORMAL HIGH (ref 65–99)
Glucose-Capillary: 228 mg/dL — ABNORMAL HIGH (ref 65–99)

## 2018-01-25 LAB — COMPREHENSIVE METABOLIC PANEL
ALBUMIN: 2.9 g/dL — AB (ref 3.5–5.0)
ALK PHOS: 64 U/L (ref 38–126)
ALT: 37 U/L (ref 14–54)
AST: 55 U/L — ABNORMAL HIGH (ref 15–41)
Anion gap: 9 (ref 5–15)
BUN: 15 mg/dL (ref 6–20)
CHLORIDE: 105 mmol/L (ref 101–111)
CO2: 24 mmol/L (ref 22–32)
CREATININE: 1.12 mg/dL — AB (ref 0.44–1.00)
Calcium: 8.5 mg/dL — ABNORMAL LOW (ref 8.9–10.3)
GFR calc Af Amer: 51 mL/min — ABNORMAL LOW (ref >60)
GFR calc non Af Amer: 44 mL/min — ABNORMAL LOW (ref >60)
GLUCOSE: 184 mg/dL — AB (ref 65–99)
Potassium: 4.2 mmol/L (ref 3.5–5.1)
SODIUM: 138 mmol/L (ref 135–145)
Total Bilirubin: 0.4 mg/dL (ref 0.3–1.2)
Total Protein: 5.8 g/dL — ABNORMAL LOW (ref 6.5–8.1)

## 2018-01-25 LAB — CBC
HCT: 31.2 % — ABNORMAL LOW (ref 36.0–46.0)
HEMOGLOBIN: 10 g/dL — AB (ref 12.0–15.0)
MCH: 29 pg (ref 26.0–34.0)
MCHC: 32.1 g/dL (ref 30.0–36.0)
MCV: 90.4 fL (ref 78.0–100.0)
PLATELETS: 217 10*3/uL (ref 150–400)
RBC: 3.45 MIL/uL — ABNORMAL LOW (ref 3.87–5.11)
RDW: 12.9 % (ref 11.5–15.5)
WBC: 7.3 10*3/uL (ref 4.0–10.5)

## 2018-01-25 LAB — MRSA PCR SCREENING: MRSA by PCR: NEGATIVE

## 2018-01-25 MED ORDER — ACETAMINOPHEN 325 MG PO TABS
650.0000 mg | ORAL_TABLET | Freq: Four times a day (QID) | ORAL | Status: DC
  Administered 2018-01-25 – 2018-01-28 (×11): 650 mg via ORAL
  Filled 2018-01-25 (×12): qty 2

## 2018-01-25 MED ORDER — BACITRACIN ZINC 500 UNIT/GM EX OINT
TOPICAL_OINTMENT | Freq: Two times a day (BID) | CUTANEOUS | Status: DC
  Administered 2018-01-25 – 2018-01-28 (×7): via TOPICAL
  Filled 2018-01-25: qty 28.35

## 2018-01-25 MED ORDER — METHOCARBAMOL 500 MG PO TABS
500.0000 mg | ORAL_TABLET | Freq: Three times a day (TID) | ORAL | Status: DC
  Administered 2018-01-25 – 2018-01-28 (×9): 500 mg via ORAL
  Filled 2018-01-25 (×11): qty 1
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
## 2018-01-25 NOTE — Progress Notes (Signed)
Patient ID: Gabriela Jefferson, female   DOB: 1932-07-03, 84 y.o.   MRN: [id number] Repeat CT improved.  No new neurosurgical recommendations.
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
## 2018-01-25 NOTE — Progress Notes (Signed)
Paged Trauma MD [phone] at [phone] to give Head CT results Interval development of posterior left parafalcine/ left tentorial broad base subdural hematoma with maximal thickness of 2.62mm Recommended Continued close CT surveillance  Awaiting return call
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
## 2018-01-25 NOTE — Progress Notes (Signed)
Chaplain visited with Patient at same time Pt was working with the PT.  Chaplain will revisit to pray with the patient concerning their needs.

## 2018-01-26 ENCOUNTER — Inpatient Hospital Stay (HOSPITAL_COMMUNITY): Payer: Medicare Other

## 2018-01-26 LAB — CBC
HCT: 29.6 % — ABNORMAL LOW (ref 36.0–46.0)
HCT: 34.2 % — ABNORMAL LOW (ref 36.0–46.0)
HEMOGLOBIN: 9.1 g/dL — AB (ref 12.0–15.0)
HEMOGLOBIN: 10.6 g/dL — AB (ref 12.0–15.0)
MCH: 28.2 pg (ref 26.0–34.0)
MCH: 28.3 pg (ref 26.0–34.0)
MCHC: 30.7 g/dL (ref 30.0–36.0)
MCHC: 31 g/dL (ref 30.0–36.0)
MCV: 91.6 fL (ref 78.0–100.0)
MCV: 91.2 fL (ref 78.0–100.0)
PLATELETS: 201 10*3/uL (ref 150–400)
PLATELETS: 228 10*3/uL (ref 150–400)
RBC: 3.23 MIL/uL — ABNORMAL LOW (ref 3.87–5.11)
RBC: 3.75 MIL/uL — AB (ref 3.87–5.11)
RDW: 13.1 % (ref 11.5–15.5)
RDW: 12.9 % (ref 11.5–15.5)
WBC: 6.8 10*3/uL (ref 4.0–10.5)
WBC: 7.9 10*3/uL (ref 4.0–10.5)

## 2018-01-26 LAB — BASIC METABOLIC PANEL
ANION GAP: 10 (ref 5–15)
BUN: 11 mg/dL (ref 6–20)
CHLORIDE: 104 mmol/L (ref 101–111)
CO2: 24 mmol/L (ref 22–32)
Calcium: 8 mg/dL — ABNORMAL LOW (ref 8.9–10.3)
Creatinine, Ser: 1.11 mg/dL — ABNORMAL HIGH (ref 0.44–1.00)
GFR calc Af Amer: 52 mL/min — ABNORMAL LOW (ref >60)
GFR, EST NON AFRICAN AMERICAN: 45 mL/min — AB (ref >60)
GLUCOSE: 116 mg/dL — AB (ref 65–99)
Potassium: 3.6 mmol/L (ref 3.5–5.1)
SODIUM: 138 mmol/L (ref 135–145)

## 2018-01-26 LAB — GLUCOSE, CAPILLARY
GLUCOSE-CAPILLARY: 145 mg/dL — AB (ref 65–99)
GLUCOSE-CAPILLARY: 152 mg/dL — AB (ref 65–99)
GLUCOSE-CAPILLARY: 187 mg/dL — AB (ref 65–99)
GLUCOSE-CAPILLARY: 191 mg/dL — AB (ref 65–99)
Glucose-Capillary: 110 mg/dL — ABNORMAL HIGH (ref 65–99)
Glucose-Capillary: 189 mg/dL — ABNORMAL HIGH (ref 65–99)

## 2018-01-26 MED ORDER — INSULIN ASPART 100 UNIT/ML ~~LOC~~ SOLN
0.0000 [IU] | Freq: Three times a day (TID) | SUBCUTANEOUS | Status: DC
  Administered 2018-01-26: 3 [IU] via SUBCUTANEOUS
  Administered 2018-01-27: 5 [IU] via SUBCUTANEOUS
  Administered 2018-01-27: 3 [IU] via SUBCUTANEOUS
  Administered 2018-01-27: 2 [IU] via SUBCUTANEOUS
  Administered 2018-01-27 – 2018-01-28 (×2): 3 [IU] via SUBCUTANEOUS
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
## 2018-01-26 NOTE — Clinical Social Work Note (Addendum)
Clinical Social Work Assessment  Patient Details  Name: Gabriela Jefferson MRN: [id number] Date of Birth: 03/23/1932  Date of referral:  01/19/18               Reason for consult:  Facility Placement, Substance Use/ETOH Abuse, Discharge Planning                Permission sought to share information with:  Facility Sport and exercise psychologist, Family Supports Permission granted to share information::  Yes, Verbal Permission Granted  Name::      Abie Killian  Agency::   SNFs  Relationship::   daughter  Contact Information:    [phone]  Housing/Transportation Living arrangements for the past 2 months:  Single Family Home Source of Information:  Patient, Adult Children Patient Interpreter Needed:  None Criminal Activity/Legal Involvement Pertinent to Current Situation/Hospitalization:  No - Comment as needed Significant Relationships:  Adult Children, Warehouse manager, Other(Comment)([employer]) Lives with:  Self, Adult Children(daughter available in the evenings; works during the day) Do you feel safe going back to the place where you live?  No Need for family participation in patient care:  No (Coment)  Care giving concerns:  Pt lives with daughter, who is unavailable during the day. Pt thinks that she would benefit from SNF to return to prior level of volunteering and getting around.   Social Worker assessment / plan: CSW met with pt and two adult sons at bedside. SBIRT completed, no further ETOH recommendations, as pt does not drink. Pt states that she is interested in SNF placement. She visits friends at nursing homes and feels that she could benefit from placement before returning back home with her daughter. Pt daughter works during the day and pt stats she went to [employer] to volunteer during the day regularly, which is is unable to currently do. Pt states preference for [facility] and [facility]. Pt is aware that [facility] would have to  approve SNF stay and PT make that recommendation as well.   Pt requested her information be sent to facilities in [place] area for SNF. CSW will complete FL2 and fax out.   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   Patient/Family's Understanding of and Emotional Response to Diagnosis, Current Treatment, and Prognosis:  Pt states understanding of diagnosis, current treatment, and prognosis. Pt states appropriate concern and awareness of supports and limitations at the current time. Pt was smiling and positive about future after SNF if she is able to go.   Emotional Assessment Appearance:  Appears stated age Attitude/Demeanor/Rapport:  Engaged, Self-Confident Affect (typically observed):  Accepting, Adaptable, Appropriate, Pleasant Orientation:  Oriented to Self, Oriented to Place, Oriented to  Time, Oriented to Situation Alcohol / Substance use:  Not Applicable Psych involvement (Current and /or in the community):  No (Comment)  Discharge Needs  Concerns to be addressed:  Care Coordination, Discharge Planning Concerns Readmission within the last 30 days:  No Current discharge risk:  Physical Impairment, Dependent with Mobility Barriers to Discharge:  Ship broker, Continued Medical Work up   [employer], [place] 01/26/2018, 5:19 PM

## 2018-01-26 NOTE — Progress Notes (Addendum)
Inpatient Diabetes Program Recommendations  AACE/ADA: New Consensus Statement on Inpatient Glycemic Control (2015)  Target Ranges:  Prepandial:   less than 140 mg/dL      Peak postprandial:   less than 180 mg/dL (1-2 hours)      Critically ill patients:  140 - 180 mg/dL   Lab Results  Component Value Date   GLUCAP 110 (H) 01/26/2018   HGBA1C 7.6 (H) 05/06/2016    Review of Glycemic Control Results for JATON, EILERS (MRN [id number]) as of 01/26/2018 09:28  Ref. Range 01/25/2018 17:45 01/25/2018 20:28 01/25/2018 23:38 01/26/2018 03:21  Glucose-Capillary Latest Ref Range: 65 - 99 mg/dL 201 (H) 206 (H) 145 (H) 110 (H)   Diabetes history: Type 2 DM Outpatient Diabetes medications: Amaryl 2 mg QAM, Metformin 500 mg QHS Current orders for Inpatient glycemic control: Novolog 0-15 units Q4H, Amaryl 2 mg QAM  Inpatient Diabetes Program Recommendations:    Last A1C from 2017 was 7.6%. Consider repeat A1C.  According to ADA, the use of Metformin is contraindicated in patients with GFR <45. Current GFR while inpatient has been 45 or less, indicating that changes should be considered at discharge. Would recommend discontinuing Amaryl and Metformin and initiating a DDP-4 like Tradjenta.   For inpatient recs, consider Lantus 8 units QD as alterative, given that patient has received Novolog 13 units of correction in last 24 hours. Patient is eating, so consider changing correction to AC+HS.   Thanks, Bronson Curb, MSN, RNC-OB Diabetes Coordinator [phone] (8a-5p)

## 2018-01-26 NOTE — Discharge Instructions (Signed)
Rib Fracture A rib fracture is a break or crack in one of the bones of the ribs. The ribs are like a cage that goes around your upper chest. A broken or cracked rib is often painful, but most do not cause other problems. Most rib fractures heal on their own in 1-3 months. Follow these instructions at home:  Avoid activities that cause pain to the injured area. Protect your injured area.  Slowly increase activity as told by your doctor.  Take medicine as told by your doctor.  Put ice on the injured area for the first 1-2 days after you have been treated or as told by your doctor. ? Put ice in a plastic bag. ? Place a towel between your skin and the bag. ? Leave the ice on for 15-20 minutes at a time, every 2 hours while you are awake.  Do deep breathing as told by your doctor. You may be told to: ? Take deep breaths many times a day. ? Cough many times a day while hugging a pillow. ? Use a device (incentive spirometer) to perform deep breathing many times a day.  Drink enough fluids to keep your pee (urine) clear or pale yellow.  Do not wear a rib belt or binder. These do not allow you to breathe deeply. Get help right away if:  You have a fever.  You have trouble breathing.  You cannot stop coughing.  You cough up thick or bloody spit (mucus).  You feel sick to your stomach (nauseous), throw up (vomit), or have belly (abdominal) pain.  Your pain gets worse and medicine does not help. This information is not intended to replace advice given to you by your health care provider. Make sure you discuss any questions you have with your health care provider. Document Released: 07/14/2008 Document Revised: 03/12/2016 Document Reviewed: 12/07/2012 Elsevier Interactive Patient Education  2018 [place].   Sternal Fracture A sternal fracture is a break in the bone in the center of your chest (sternum or breastbone). This fracture is not dangerous unless there is also an injury to  your heart or lungs, which are protected by the sternum and ribs. What are the causes? This condition is usually caused by a forceful injury from:  Motor vehicle collisions. This is the most common cause.  Contact sports.  Physical assaults.  You can also have a sternal fracture without having a forceful injury if the bone becomes weakened over time (stress fracture or insufficiency fracture). What increases the risk? You may be at greater risk for a sternal fracture if you:  Participate in direct contact sports, such as football or wrestling.  Work at elevated heights, such as in Architect.  Other risk factors for a stress or insufficiency sternal fracture include:  Being female.  Being a postmenopausal woman.  Being age 86 or older.  Having osteoporosis.  Having severe curvature of the spine.  Being on long-term steroid treatment.  What are the signs or symptoms? Symptoms of this condition include:  Pain over the sternum.  Pain when pressing on the sternum.  Pain that gets worse with deep breathing or coughing.  Shortness of breath.  Bruising.  Swelling.  A crackling sound when taking a deep breath or pressing on the sternum.  How is this diagnosed? This condition is diagnosed with a medical history and physical exam. You may also have imaging tests, including:  CT scan.  Ultrasound.  Chest X-rays that are taken from a side view.  Your health care provider may check your blood oxygen level with a pulse oximetry test. You may also have repeated electrocardiograms (ECGs) to make sure that your heart has not been injured. You may also have a blood test to check for damage to your heart muscle. How is this treated? Treatment depends on the severity of your injury. A sternal fracture without any other injury (isolated sternal fracture) usually heals without treatment. You may need to limit (restrict) some activities at home and take medicine for pain  relief. In rare cases, you may need surgery to repair a sternal fracture that continues to cause severe pain or a sternal fracture that involves bones that have been moved out of position considerably (displaced fracture). Follow these instructions at home:  Take over-the-counter and prescription medicines only as told by your health care provider.  Rest at home. Return to your normal activities as told by your health care provider. Ask your health care provider what activities are safe for you.  If directed, apply ice to the injured area: ? Put ice in a plastic bag. ? Place a towel between your skin and the bag. ? Leave the ice on for 20 minutes, 2-3 times a day.  Do not lift anything that is heavier than 10 lb (4.5 kg) until your health care provider says it is safe.  Do not drive or operate heavy machinery while taking prescription pain medicine.  Do not use any tobacco products, such as cigarettes, chewing tobacco, and e-cigarettes. If you need help quitting, ask your health care provider.  Keep all follow-up visits as told by your health care provider. This is important. Contact a health care provider if:  Your pain medicine is not helping.  You continue to have pain after several weeks.  You develop a fever.  You develop a cough and you have thick or bloody mucus (sputum). Get help right away if:  You have difficulty breathing.  You have chest pain.  You have an abnormal heartbeat (palpitations).  You feel nauseous or you have pain in your abdomen. This information is not intended to replace advice given to you by your health care provider. Make sure you discuss any questions you have with your health care provider. Document Released: 05/19/2004 Document Revised: 06/02/2016 Document Reviewed: 05/01/2015 Elsevier Interactive Patient Education  [employer].
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
## 2018-01-26 NOTE — NC FL2 (Signed)
[place] MEDICAID FL2 LEVEL OF CARE SCREENING TOOL     IDENTIFICATION  Patient Name: Gabriela Jefferson Birthdate: August 18, 1932 Sex: female Admission Date (Current Location): 01/24/2018  [facility] and [place] Number:  Producer, television/film/video and Address:  [facility], [address], [place], [place] [address]      Provider Number: [id number]  Attending Physician Name and Address:  Md, Trauma, MD  Relative Name and Phone Number:  Junelle Gane, [phone]    Current Level of Care: Hospital Recommended Level of Care: Skilled Nursing Facility Prior Approval Number:    Date Approved/Denied:   PASRR Number: [id number]  Discharge Plan: SNF    Current Diagnoses: Patient Active Problem List   Diagnosis Date Noted  . SDH (subdural hematoma) (HCC) 01/24/2018  . Dysphagia 05/07/2016  . Reflux 05/07/2016  . Constipation 05/07/2016  . Chest pain 05/06/2016  . RLQ PAIN 08/08/2009  . CONSTIPATION 02/04/2009  . Type I diabetes mellitus with complication, uncontrolled (HCC) 12/26/2008  . GASTROPARESIS 12/19/2008  . DYSPHAGIA 12/19/2008    Orientation RESPIRATION BLADDER Height & Weight     Self, Time, Situation, Place  Normal Continent, External catheter Weight:   Height:     BEHAVIORAL SYMPTOMS/MOOD NEUROLOGICAL BOWEL NUTRITION STATUS      Continent Diet(Heart Healthy/carb modified)  AMBULATORY STATUS COMMUNICATION OF NEEDS Skin   Limited Assist Verbally  Abrasion L Knee                        Personal Care Assistance Level of Assistance  Bathing, Feeding, Dressing Bathing Assistance: Limited assistance Feeding assistance: Independent Dressing Assistance: Limited assistance     Functional Limitations Info  Sight, Hearing, Speech Sight Info: Adequate Hearing Info: Adequate Speech Info: Adequate    SPECIAL CARE FACTORS FREQUENCY  PT (By licensed PT), OT (By licensed OT)     PT Frequency: 5x wk OT Frequency: 5x wk             Contractures Contractures Info: Not present    Additional Factors Info  Code Status, Allergies, Insulin Sliding Scale Code Status Info: Full Code Allergies Info: OXYCODONE    Insulin Sliding Scale Info: Amaryl 2 mg QAM, Metformin 500 mg QHS       Current Medications (01/26/2018):  This is the current hospital active medication list Current Facility-Administered Medications  Medication Dose Route Frequency Provider Last Rate Last Dose  . acetaminophen (TYLENOL) tablet 650 mg  650 mg Oral Q6H Rayburn, Kelly A, PA-C   650 mg at 01/26/18 1108  . albuterol (PROVENTIL) (2.5 MG/3ML) 0.083% nebulizer solution 3 mL  3 mL Inhalation Q4H PRN Cornett, Thomas, MD      . bacitracin ointment   Topical BID Rayburn, Kelly A, PA-C      . glimepiride (AMARYL) tablet 2 mg  2 mg Oral Q breakfast Cornett, Thomas, MD   2 mg at 01/26/18 0840  . hydrALAZINE (APRESOLINE) injection 10 mg  10 mg Intravenous Q2H PRN Cornett, Thomas, MD      . hydrochlorothiazide (MICROZIDE) capsule 12.5 mg  12.5 mg Oral Daily PRN Cornett, Thomas, MD      . insulin aspart (novoLOG) injection 0-15 Units  0-15 Units Subcutaneous TID AC & HS Rayburn, Kelly A, PA-C      . irbesartan (AVAPRO) tablet 150 mg  150 mg Oral Daily Cornett, Thomas, MD   150 mg at 01/26/18 1108  . levothyroxine (SYNTHROID, LEVOTHROID) tablet 100 mcg  100  mcg Oral QAC breakfast Harriette Bouillon, MD   100 mcg at 01/26/18 0840  . methocarbamol (ROBAXIN) tablet 500 mg  500 mg Oral TID Rayburn, Kelly A, PA-C   500 mg at 01/26/18 1108  . morphine 4 MG/ML injection 1 mg  1 mg Intravenous Q1H PRN Harriette Bouillon, MD   1 mg at 01/25/18 0041  . ondansetron (ZOFRAN-ODT) disintegrating tablet 4 mg  4 mg Oral Q6H PRN Cornett, Thomas, MD       Or  . ondansetron (ZOFRAN) injection 4 mg  4 mg Intravenous Q6H PRN Cornett, Maisie Fus, MD   4 mg at 01/25/18 1349  . pantoprazole (PROTONIX) EC tablet 40 mg  40 mg Oral Daily Cornett, Thomas, MD   40 mg at 01/26/18 1109  . rosuvastatin  (CRESTOR) tablet 10 mg  10 mg Oral Daily Cornett, Thomas, MD   10 mg at 01/26/18 1108  . traMADol (ULTRAM) tablet 50 mg  50 mg Oral Q6H PRN Harriette Bouillon, MD   50 mg at 01/25/18 1051     Discharge Medications: Please see discharge summary for a list of discharge medications.  Relevant Imaging Results:  Relevant Lab Results:   Additional Information SS# [id number]  Doy Hutching, [place]

## 2018-01-27 LAB — BASIC METABOLIC PANEL
Anion gap: 11 (ref 5–15)
BUN: 11 mg/dL (ref 6–20)
CALCIUM: 8.2 mg/dL — AB (ref 8.9–10.3)
CHLORIDE: 106 mmol/L (ref 101–111)
CO2: 24 mmol/L (ref 22–32)
CREATININE: 1.15 mg/dL — AB (ref 0.44–1.00)
GFR calc non Af Amer: 43 mL/min — ABNORMAL LOW (ref >60)
GFR, EST AFRICAN AMERICAN: 50 mL/min — AB (ref >60)
Glucose, Bld: 107 mg/dL — ABNORMAL HIGH (ref 65–99)
Potassium: 3.9 mmol/L (ref 3.5–5.1)
SODIUM: 141 mmol/L (ref 135–145)

## 2018-01-27 LAB — GLUCOSE, CAPILLARY
GLUCOSE-CAPILLARY: 96 mg/dL (ref 65–99)
GLUCOSE-CAPILLARY: 151 mg/dL — AB (ref 65–99)
Glucose-Capillary: 104 mg/dL — ABNORMAL HIGH (ref 65–99)
Glucose-Capillary: 119 mg/dL — ABNORMAL HIGH (ref 65–99)
Glucose-Capillary: 125 mg/dL — ABNORMAL HIGH (ref 65–99)
Glucose-Capillary: 142 mg/dL — ABNORMAL HIGH (ref 65–99)
Glucose-Capillary: 228 mg/dL — ABNORMAL HIGH (ref 65–99)

## 2018-01-27 LAB — CBC
HEMATOCRIT: 30.2 % — AB (ref 36.0–46.0)
HEMOGLOBIN: 9.7 g/dL — AB (ref 12.0–15.0)
MCH: 29.6 pg (ref 26.0–34.0)
MCHC: 32.1 g/dL (ref 30.0–36.0)
MCV: 92.1 fL (ref 78.0–100.0)
Platelets: 224 10*3/uL (ref 150–400)
RBC: 3.28 MIL/uL — ABNORMAL LOW (ref 3.87–5.11)
RDW: 13.2 % (ref 11.5–15.5)
WBC: 6.2 10*3/uL (ref 4.0–10.5)

## 2018-01-27 LAB — HEMOGLOBIN A1C
Hgb A1c MFr Bld: 7.5 % — ABNORMAL HIGH (ref 4.8–5.6)
Mean Plasma Glucose: 168.55 mg/dL
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
## 2018-01-27 NOTE — Social Work (Signed)
CSW met with patient at bedside along with daughter and discussed SNF offers. Pt accepted bed offer from [facility] and [facility].  CSW called admission staff at [facility] and they confirmed bed offer. They will initiate Insurance Auth for placement.  CSW will continue to follow for disposition.  Elissa Hefty, LCSW Clinical Social Worker [phone]

## 2018-01-28 LAB — GLUCOSE, CAPILLARY
Glucose-Capillary: 99 mg/dL (ref 65–99)
Glucose-Capillary: 108 mg/dL — ABNORMAL HIGH (ref 65–99)
Glucose-Capillary: 183 mg/dL — ABNORMAL HIGH (ref 65–99)
Glucose-Capillary: 245 mg/dL — ABNORMAL HIGH (ref 65–99)

## 2018-01-28 LAB — CBC
HEMATOCRIT: 33 % — AB (ref 36.0–46.0)
HEMOGLOBIN: 10.6 g/dL — AB (ref 12.0–15.0)
MCH: 28.9 pg (ref 26.0–34.0)
MCHC: 32.1 g/dL (ref 30.0–36.0)
MCV: 89.9 fL (ref 78.0–100.0)
Platelets: 286 10*3/uL (ref 150–400)
RBC: 3.67 MIL/uL — AB (ref 3.87–5.11)
RDW: 12.8 % (ref 11.5–15.5)
WBC: 6.3 10*3/uL (ref 4.0–10.5)

## 2018-01-28 MED ORDER — CALCIUM CARBONATE ANTACID 500 MG PO CHEW: 1.0000 | CHEWABLE_TABLET | Freq: Two times a day (BID) | ORAL | Status: DC

## 2018-01-28 MED ORDER — ENOXAPARIN SODIUM 40 MG/0.4ML ~~LOC~~ SOLN
40.0000 mg | SUBCUTANEOUS | Status: DC
  Administered 2018-01-28: 40 mg via SUBCUTANEOUS
  Filled 2018-01-28: qty 0.4

## 2018-01-28 MED ORDER — METHOCARBAMOL 500 MG PO TABS: 500.0000 mg | ORAL_TABLET | Freq: Three times a day (TID) | ORAL | 0 refills | Status: DC

## 2018-01-28 MED ORDER — DOCUSATE SODIUM 100 MG PO CAPS: 100.0000 mg | ORAL_CAPSULE | Freq: Two times a day (BID) | ORAL | 0 refills | Status: DC

## 2018-01-28 MED ORDER — POLYETHYLENE GLYCOL 3350 17 G PO PACK
17.0000 g | PACK | Freq: Every day | ORAL | Status: DC
  Administered 2018-01-28: 17 g via ORAL
  Filled 2018-01-28: qty 1

## 2018-01-28 MED ORDER — POLYETHYLENE GLYCOL 3350 17 G PO PACK: 17.0000 g | PACK | Freq: Every day | ORAL | Status: DC | PRN

## 2018-01-28 MED ORDER — TRAMADOL HCL 50 MG PO TABS: 50.0000 mg | ORAL_TABLET | Freq: Four times a day (QID) | ORAL | 0 refills | Status: DC | PRN

## 2018-01-28 MED ORDER — GUAIFENESIN ER 600 MG PO TB12: 600.0000 mg | ORAL_TABLET | Freq: Two times a day (BID) | ORAL | Status: DC

## 2018-01-28 MED ORDER — POLYETHYLENE GLYCOL 3350 17 G PO PACK: 17.0000 g | PACK | Freq: Every day | ORAL | 0 refills | Status: AC

## 2018-01-28 MED ORDER — ACETAMINOPHEN 325 MG PO TABS: 650.0000 mg | ORAL_TABLET | Freq: Four times a day (QID) | ORAL | Status: AC

## 2018-01-28 MED ORDER — GUAIFENESIN ER 600 MG PO TB12
600.0000 mg | ORAL_TABLET | Freq: Two times a day (BID) | ORAL | Status: DC
  Administered 2018-01-28: 600 mg via ORAL
  Filled 2018-01-28: qty 1

## 2018-01-28 MED ORDER — DOCUSATE SODIUM 100 MG PO CAPS
100.0000 mg | ORAL_CAPSULE | Freq: Two times a day (BID) | ORAL | Status: DC
  Administered 2018-01-28: 100 mg via ORAL
  Filled 2018-01-28: qty 1

## 2018-01-28 MED ORDER — MORPHINE SULFATE (PF) 4 MG/ML IV SOLN: 1.0000 mg | INTRAVENOUS | Status: DC | PRN

## 2018-01-28 MED ORDER — CALCIUM CARBONATE ANTACID 500 MG PO CHEW
1.0000 | CHEWABLE_TABLET | Freq: Two times a day (BID) | ORAL | Status: DC
  Administered 2018-01-28: 200 mg via ORAL
  Filled 2018-01-28: qty 1

## 2018-01-28 MED ORDER — BACITRACIN ZINC 500 UNIT/GM EX OINT: TOPICAL_OINTMENT | Freq: Two times a day (BID) | CUTANEOUS | 0 refills | Status: DC
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
## 2018-01-28 NOTE — Clinical Social Work Placement (Signed)
CLINICAL SOCIAL WORK PLACEMENT  NOTE  Date:  01/28/2018  Patient Details  Name: Gabriela Jefferson MRN: [id number] Date of Birth: May 16, 1932  Clinical Social Work is seeking post-discharge placement for this patient at the Skilled  Nursing Facility level of care (*CSW will initial, date and re-position this form in  chart as items are completed):  Yes   Patient/family provided with [place] Clinical Social Work Department's list of facilities offering this level of care within the geographic area requested by the patient (or if unable, by the patient's family).  Yes   Patient/family informed of their freedom to choose among providers that offer the needed level of care, that participate in Medicare, Medicaid or managed care program needed by the patient, have an available bed and are willing to accept the patient.  Yes   Patient/family informed of [place]'s ownership interest in [facility] and [facility], as well as of the fact that they are under no obligation to receive care at these facilities.  PASRR submitted to EDS on       PASRR number received on 01/26/18     Existing PASRR number confirmed on       FL2 transmitted to all facilities in geographic area requested by pt/family on 01/26/18     FL2 transmitted to all facilities within larger geographic area on       Patient informed that his/her managed care company has contracts with or will negotiate with certain facilities, including the following:        Yes   Patient/family informed of bed offers received.  Patient chooses bed at [name]     Physician recommends and patient chooses bed at      Patient to be transferred to [name] on 01/28/18.  Patient to be transferred to facility by [facility]     Patient family notified on 01/28/18 of transfer.  Name of family member notified:  son at bedside     PHYSICIAN       Additional  Comment:    _______________________________________________ Tresa Moore, LCSW 01/28/2018, 3:14 PM

## 2018-01-28 NOTE — Care Management Important Message (Signed)
Important Message  Patient Details  Name: Gabriela Jefferson MRN: [id number] Date of Birth: 10-18-1932   Medicare Important Message Given:  Yes    Orbie Pyo 01/28/2018, 2:03 PM

## 2018-01-28 NOTE — Progress Notes (Signed)
Report called Gardiner Ramus,  RN at [facility] care.

## 2018-01-28 NOTE — Social Work (Signed)
Clinical Social Worker facilitated patient discharge including contacting patient family and facility to confirm patient discharge plans.  Clinical information faxed to facility and family agreeable with plan.    CSW arranged ambulance transport via [facility] to [facility].    RN to call [phone] to give report prior to discharge.  Pt going to Room 105.  Clinical Social Worker will sign off for now as social work intervention is no longer needed. Please consult [place] again if new need arises.  Elissa Hefty, LCSW Clinical Social Worker [phone]
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
## 2018-06-08 ENCOUNTER — Other Ambulatory Visit: Payer: Self-pay | Admitting: Internal Medicine

## 2018-06-08 DIAGNOSIS — S065X9A Traumatic subdural hemorrhage with loss of consciousness of unspecified duration, initial encounter: Secondary | ICD-10-CM

## 2018-06-08 DIAGNOSIS — S065XAA Traumatic subdural hemorrhage with loss of consciousness status unknown, initial encounter: Secondary | ICD-10-CM

## 2018-06-16 ENCOUNTER — Other Ambulatory Visit: Payer: Medicare Other

## 2018-06-16 ENCOUNTER — Ambulatory Visit
Admission: RE | Admit: 2018-06-16 | Discharge: 2018-06-16 | Disposition: A | Discharge status: Home / Self Care | Payer: Medicare Other | Source: Ambulatory Visit | Attending: Internal Medicine | Admitting: Internal Medicine

## 2018-06-16 DIAGNOSIS — S065XAA Traumatic subdural hemorrhage with loss of consciousness status unknown, initial encounter: Secondary | ICD-10-CM

## 2018-06-16 DIAGNOSIS — S065X9A Traumatic subdural hemorrhage with loss of consciousness of unspecified duration, initial encounter: Secondary | ICD-10-CM
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
## 2019-07-14 ENCOUNTER — Other Ambulatory Visit: Payer: Self-pay

## 2019-07-14 NOTE — Patient Outreach (Signed)
[facility] Evaluation Interviewer made contact with patient. HIPAA verification completed. Aging Gracefully survey completed.   Interviewer will send referral to OT for follow up.   [facility] Management Assistant

## 2019-07-21 ENCOUNTER — Other Ambulatory Visit: Payer: Self-pay | Admitting: Specialist

## 2019-07-24 ENCOUNTER — Other Ambulatory Visit: Payer: Self-pay | Admitting: Specialist

## 2019-07-29 ENCOUNTER — Other Ambulatory Visit: Payer: Self-pay

## 2019-07-29 ENCOUNTER — Other Ambulatory Visit: Payer: Self-pay | Admitting: Specialist

## 2019-07-29 NOTE — Patient Instructions (Addendum)
Will address bathroom transfers at next visit.

## 2019-07-29 NOTE — Patient Outreach (Signed)
Aging Gracefully Program  OT Initial Visit  07/29/2019  DORISMAR BASSE 10-16-1932 [id number]  Visit:  1- Initial Visit  Start Time:  0950 End Time:  1110 Total Minutes:  80  CCAP: Typical Daily Routine: Typical Daily Routine:: Patient lives with her daughter.  She is home alone all day while daughter works, daughter home at night and on weekends.  Patient gets up around 7-8 and is able to complete all necessary BADLs on her own.  Prior to COVID, she completed exercise at the senior centre 3 days a [facility], riding the [employer] bus to get there and back.  she does drive minimally.  She goes to bed around 8 pm.  Medical history:  DM, elevated cholesterol, thyroid, blood pressure.  has 5 children, daughter lives with her What Types Of Care Problems Are You Having Throughout The Day?: trouble getting up from chairs, trouble staying asleep, trouble getting up and down from commodes  What Kind Of Help Do You Receive?: minimal IADL help from daughter  Do You Think You Need Other Types Of Help?: no What Do You Think Would Make Everyday Life Easier For You?: a different commode and some grabbars  What Is A Good Day Like?: going to senior center What Is A Bad Day Like?: n/a Do You Have Time For Yourself?: yes  Patient Reported Equipment: Patient Reported Equipment Currently Used: Binnie Rail, Sports administrator, Paediatric nurse, Single [employer], Sock Aid Functional Mobility-Walking Indoors/Getting Around the [employer]:   Functional Mobility-Walk A Block: Walk A Block: No Difficulty Do You:: Use A Device Importance Of Learning New Strategies:: Not At All Functional Mobility-Maintain Balance While Showering: Maintaining Balance While Showering: A Lot Of Difficulty Do You:: Use A Device Importance Of Learning New Strategies:: A Little Other Comments:: grabbars would be helpful Functional Mobility-Stooping, Crouching, Kneeling To Retreive Item: Stooping, Crouching, or Kneeling To Retrieve Item: A Little  Difficulty Do You:: No Device/No Assistance Importance Of Learning New Strategies:: A Little Functional Mobility-Bending From Standing Position To Pick Up Clothing Off The Floor: Bending Over From Standing Position To Pick Up Clothing Off The Floor: No Difficulty Do You:: Use A Device Importance Of Learning New Strategies:: Not At All Other Comments:: uses a reacher Functional Mobility-Reaching For Items Above Shoulder Level:   Functional Mobility-Climb 1 Flight Of Stairs: Climb 1 Flight Of Stairs: N/A Functional Mobility-Move In And Out Of Chair: Move In and Out Of A Chair: Moderate Difficulty Do You:: No Device/No Assistance Importance Of Learning New Strategies:: Moderate Observation: Move In And Out Of Chair: Independent With Pain, Difficulty, Or Use Of Device Safety: A Little Risk Efficiency: Somewhat Intervention: Yes Other Comments:: recommend board under cushion, leg exercises  Functional Mobility-Move In And Out Of Bed: Walking Indoors/Getting Around The House: No Difficulty Do You:: No Device/No Assistance Importance Of Learning New Strategies:: Not At All Functional Mobility-Move In And Out Of Bath/Shower: Move In And Out Of A Bath/Shower: A Lot Of Difficulty Do You:: Use Both A Device And Personal Assistance Importance Of Learning New Strategies:: Moderate Other Comments:: unable to lift legs in, would benefit from a ttb, keeps shower seat in closet on other side of house  Observation: Move In And Out Of Bath/Shower: Maximal Assistance Safety: Extreme Risk Efficiency: Very Intervention: Yes Functional Mobility-Get On And Off Toilet: Getting Up From The Floor: Unable To Do Do You:: No Device/No Assistance Importance Of Learning New Strategies:: Moderate Functional Mobility-Into And Out Of Car, Not Including Driving: Into  And Out Of  Car, Not Including Driving: No Difficulty Do You:: No Device/No Assistance Importance Of Learning New Strategies:: Not At  All Functional Mobility-Other Mobility Difficulty: Other Mobility Identified:: n/a    Activities of Daily Living-Bathing/Showering: ADL-Bathing/Showering: No Difficulty Do You:: N/A-Not Applicable Activities of Daily Living-Personal Hygiene and Grooming: Personal Hygiene and Grooming: No Difficulty Do You:: N/A-Not Applicable Activities of Daily Living-Toilet Hygiene: Toilet Hygiene: No Difficulty Do You:: N/A Importance Of Learning New Strategies: Not At All Activities of Daily Living-Put On And Take Off Undergarments (Incl. Fasteners): Put On And Take Off Undergarments (Incl. Fasteners): No Difficulty Do You:: N/A-Not Applicable Activities of Daily Living-Put On And Take Off Shirt/Dress/Coat (Incl. Fasteners): Put On And Take Off Shirt/Dress/Coat (Incl. Fasteners): No Difficulty Do You:: N/A-Not Applicable Activities of Daily Living-Put On And Take Off Socks And Shoes: Put On And Take Off Socks And  Shoes: No Difficulty Do You:: N/A-Not Applicable Activities of Daily Living-Feed Self: Feed Self: No Difficulty Do You:: N/A-Not Applicable Activities of Daily Living-Rest And Sleep: Rest and Sleep: A Little Difficulty Do You:: N/A-Not Applicable Importance Of Learning New Strategies: Moderate Other Comments:: wakes up to go to bathroom cant get back to sleep ADL Observation: Rest And Sleep: Independent Safety: No Risk Efficiency: Somewhat Intervention: Yes Activities of Daily Living-Sexual Activity: Sexual  Activity: N/A Activities of Daily Living-Other Activity Identified: Other Activity DIficulty: N/A  Instrumental Activities of Daily Living-Light Homemaking (Laundry, Straightening Up, Vacuuming):  Do Light Homemaking (Laundry, Straightening Up, Vacuuming): No Difficulty Do You:: N/A-Not Applicable Instrumental Activities of Daily Living-Making A Bed: Making a Bed: No Difficulty Do You:: N/A-Not Applicable Instrumental Activities of Daily Living-Washing Dishes By Hand  While Standing At The Sink: Washing Dishes By Hand While Standing At The Sink: No Difficulty Do You:: N/A-Not Applicable Instrumental Activities of Daily Living-Grocery Shopping: Do Grocery Shopping: No Difficulty Do You:: N/A-Not Applicable Instrumental Activities of Daily Living-Use Telephone: Use Telephone: No Difficulty Do You:: N/A-Not Applicable Instrumental Activities of Daily Living-Financial Management: Financial Management: No Difficulty Do You:: N/A-Not Applicable Instrumental Activities of Daily Living-Medications: Take Medications: No Difficulty Do You:: N/A-Not Applicable Instrumental Activities of Daily Living-Health Management And Maintenance: Health Management & Maintenance: No Difficulty Do You:: N/A-Not Applicable Instrumental Activities of Daily Living-Meal Preparation and Clean-Up: Meal Preparation and Clean-Up: No Difficulty Do You:: N/A-Not Applicable Instrumental Activities of Daily Living-Provide Care For Others/Pets: Care For Others/Pets: N/A Instrumental Activities of Daily Living-Take Part In Organized Social Activities: Take Part In Organized Social Activities: No Difficulty Do You:: N/A-Not Applicable Instrumental Activities of Daily Living-Leisure Participation: Leisure Participation: No Difficulty Do You:: N/A-Not Applicable Instrumental Activities of Daily Living-Employment/Volunteer Activities: Employment/Volunteer Activities: N/A Instrumental Activities of Daily Living-Other Identifies: Other IADL Identified Difficulty: N/A  Readiness To Change Score:  Readiness to Change Score: 9  Home Environment Assessment: Outside Home Entry:: patient feels window needs caulked - not a safety issue Living Room:: seats are low, would benefit from plywood under cushions for increased ease with transfers Bathroom:: master bathroom, patient needs grabbars in front of and to the side of toilet.  under vanity needs repaired, toilet is leaking.  hall bathroom -  needs comfort level commode, grabbar for commode, grabbar for shower, tub transfer bench Other Home Environment Concerns:: needs a handrail going from porch to sun room on step. sun room ceiling needs repaired   Patient Education:  issued fall prevention/ home safety booklet.    Goals: Goals Addressed            This Visit's  Progress   . Patient Stated       Increase safety and independence getting up and down from commodes in her home.     . patient stated       Improve ease and independence getting up and down from chairs.    . Patient Stated       Increase independence and safety getting in and out of shower.    . Patient Stated       Improve sleep hygiene       Post Clinical Reasoning: Clinician View Of Client Situation:: patient is very independent for her age.  She is aware of transfer needs for commode and couches.  she is a bit hesitant to try a ttb for improved safety in bathroom Client View Of His/Her Situation:: doing well, would like to make getting up from commode easier.  Clinician View of Client Situation: Clinician View Of Client Situation:: patient is very independent for her age.  She is aware of transfer needs for commode and couches.  she is a bit hesitant to try a ttb for improved safety in bathroom Client's View of His/Her Situation: Client View Of His/Her Situation:: doing well, would like to make getting up from commode easier.  Next visit plan:  Brainstorm and problem solve for getting on and off commodes easier and in and out of shower with increased safety.   Shirlean Mylar, MHA, OTR/L [phone]

## 2019-08-09 ENCOUNTER — Other Ambulatory Visit: Payer: Self-pay

## 2019-08-09 NOTE — Patient Outreach (Signed)
[facility] Aging Gracefully: Placed call to patient and reviewed reason for call.  Offered home visit on 08/11/2019 at 10:30 am and patient accepted.  Confirmed address.  Tomasa Rand, RN, BSN, CEN [facility] [employer] [phone]

## 2019-08-11 ENCOUNTER — Other Ambulatory Visit: Payer: Self-pay
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
## 2019-08-22 ENCOUNTER — Other Ambulatory Visit: Payer: Self-pay | Admitting: Specialist

## 2019-08-22 NOTE — Patient Outreach (Signed)
Patient called to cancel visit for 08/24/19, as daughter was exposed to [place] at work and will be getting tested tomorrow.  Patient to call therapist back once test results are in. Vangie Bicker, [place], OTR/L [phone]

## 2019-08-24 ENCOUNTER — Encounter: Payer: Medicare Other | Admitting: Specialist

## 2019-09-06 ENCOUNTER — Other Ambulatory Visit: Payer: Self-pay

## 2019-09-06 ENCOUNTER — Other Ambulatory Visit: Payer: Self-pay | Admitting: Specialist

## 2019-09-06 NOTE — Patient Instructions (Addendum)
Goals Addressed            This Visit's Progress   . Patient Stated       Improve sleep hygiene ACTION PLANNING - CUSTOM  Target Problem Area:  difficulty sleeping through the night   Why Problem May Occur: Patient states she drinks gingerale and water before bed Patient watches tv from 8-10 and then goes to sleep Goes to the bathroom at 8 Sleeps better if she is warm, and her hands stay cold Wakes up 2 times a night to go to the bathroom       Target Goal:  sleep through the night without awaking several times for going to the bathroom.      Simplifying the way you set up tasks or daily routines: DO:   Restrict fluid intake close to bedtime   Continue to exercise throughout the day   Wear gloves at night to keep hands warm   Go to the bathroom right before bed   If you awake in the night repeat a calming phrase as you drift back to sleep   Use same bed time each night and same routine    Practice It is important to practice the strategies so we can determine if they will be effective in helping to reach your goal. Follow these specific recommendations: 1.  Go to the bathroom at 8 when you get in bed, and then again at 10 right before you go to sleep. 2.    Wear gloves to bed to keep your hands warm.   3.  If a strategy does not work the first time, try it again and again (and maybe again). We may make some changes over the next few sessions, based on how they work.  Gabriela Jefferson, [place], OTR/L [phone]   Next OT visit is 10/04/19 at 3 pm

## 2019-09-06 NOTE — Patient Outreach (Signed)
Aging Gracefully Program  OT Follow-Up Visit  09/06/2019  Gabriela Jefferson Dec 30, 1931 [id number]  Visit:  2- Second Visit  Start Time:  [id number] End Time:  [id number] Total Minutes:  56   Patient Education: Education Provided: Yes Education Details: education provided on fall recovery and sleep hygiene (handouts provided for both) Person(s) Educated: Patient Comprehension: Verbalized Understanding, Returned Demonstration  Goals:  Goals Addressed            This Visit's Progress   . Patient Stated       Improve sleep hygiene ACTION PLANNING - CUSTOM  Target Problem Area:  difficulty sleeping through the night   Why Problem May Occur: Patient states she drinks gingerale and water before bed Patient watches tv from 8-10 and then goes to sleep Goes to the bathroom at 8 Sleeps better if she is warm, and her hands stay cold Wakes up 2 times a night to go to the bathroom       Target Goal:  sleep through the night without awaking several times for going to the bathroom.      Simplifying the way you set up tasks or daily routines: DO:   Restrict fluid intake close to bedtime   Continue to exercise throughout the day   Wear gloves at night to keep hands warm   Go to the bathroom right before bed   If you awake in the night repeat a calming phrase as you drift back to sleep   Use same bed time each night and same routine    Practice It is important to practice the strategies so we can determine if they will be effective in helping to reach your goal. Follow these specific recommendations: 1.  Go to the bathroom at 8 when you get in bed, and then again at 10 right before you go to sleep. 2.    Wear gloves to bed to keep your hands warm.   3.  If a strategy does not work the first time, try it again and again (and maybe again). We may make some changes over the next few sessions, based on how they work.  Vangie Bicker, MHA, OTR/L [phone]          Post  Clinical Reasoning: Client Action (Goal) One Interventions: Sleep through the night without awaking Did Client Try?: No Reason Client Did Not Try?: Other(discussed and plans on trying - could not try during session due to not bedtime) Targeted Problem Area Status: A Little Better(anticipate improvement, will follow up at next session) Clinician View Of Client Situation:: Patient is excited about upcoming modifications.  She is thoughtful in her problemsolving and had already began problemsolving some areas to improve her sleep.  She was very receptive to the ideas provided this date Client View Of His/Her Situation:: doing well, open to suggestions, excited to try suggestions developed during brainstorming session. Next Visit Plan:: improve safety with tub transfers Vangie Bicker, [place], OTR/L [phone]

## 2019-09-08 ENCOUNTER — Other Ambulatory Visit: Payer: Self-pay

## 2019-09-08 NOTE — Patient Outreach (Signed)
Care coordination:  Placed call to patient to schedule next home visit. Offered appointment on 08/11/2019 at 10:30 and patient accepted.  PLAN: 2nd nursing aging gracefully visit on 08/11/2019  Tomasa Rand, RN, BSN, CEN [facility] [employer] [phone]

## 2019-09-11 ENCOUNTER — Other Ambulatory Visit: Payer: Self-pay

## 2019-09-11 NOTE — Patient Outreach (Signed)
RN Aging Gracefully Program  RN Visit  09/11/2019  Gabriela Jefferson 07/02/1932 [id number]  Visit:   RN #2  Start Time:   [id number] End Time:   [id number] Total Minutes:   41  Readiness To Change Score:     Universal RN Interventions: Calendar Distribution: Yes Exercise Review: Yes Medications: Yes Mood: Yes Pain: Yes PCP Advocacy/Support: No Fall Prevention: Yes Incontinence: No Clinician View Of Client Situation: Ambulating well. Appears happy. Client View Of His/Her Situation: reports no falls, reports improved sleeping, Reports improvement in [place] with using cell phone  Healthcare Provider Communication: Did Higher education careers adviser With [place] Provider?: No  Clinician View of Client Situation: Clinician View Of Client Situation: Ambulating well. Appears happy. Client's View of His/Her Situation: Client View Of His/Her Situation: reports no falls, reports improved sleeping, Reports improvement in [place] with using cell phone  Medication Assessment:Reviewed medications. Patient received last AVS with medication listed. Denies any new medications or any new concerns about medications.     Session Summary: Patient is anxiously awaiting home modifications.  Reports she has signed and sent in her paper work. Reports no falls. Report no changes in health or medications.  Reviewed and demonstrated home exercises and reviewed importance. Uses teach back method. Patient able to return demonstrate exercises.   Reviewed with client her improvement in sleep patterns. Patient reports she is sleeping better when going to the bathroom before going to bed.  Reports gloves are keeping her hand warm and she is sleeping better.  Patient reports increased comfort level at using her cell phone. Reports being able to call family member when needs. Request directions. Will look for direction on how to use cell phone. 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   Will also communicate with  patient update on when expected modifications will get started.   Reviewed next nurse visit in 1 month. Encouraged patient to avoid exposures to COVID.  Reviewed signs and symptoms of covid and when to call MD.    GOALS: see updated goals.  Plan: RN home visit in 1 month.  Will contact community housing to inquire about repairs. Will research directions for cell phone-- assurance Encouraged daily Aging Gracefully exercises.   Tomasa Rand, RN, BSN, CEN [facility] [employer] [phone]

## 2019-09-22 ENCOUNTER — Other Ambulatory Visit: Payer: Self-pay

## 2019-09-22 NOTE — Patient Outreach (Signed)
Aging Gracefully Care Coordination:   Received email from [employer] today that states home modification will start on 09/26/2019.  Placed call to patient to inform and she noted getting a call from [employer] today. Reports she spoke with Mitzi Hansen.  Reviewed instructions for cell phone.  Patient does not know what kind of ASSURANCE phone she has. She reports she is doing her exercises and is sleeping better.  PLAN: follow up call in 10 days to schedule home visit #3.  Tomasa Rand, RN, BSN, CEN [facility] [employer] [phone]

## 2019-10-04 ENCOUNTER — Other Ambulatory Visit: Payer: Self-pay | Admitting: Specialist

## 2019-10-04 ENCOUNTER — Other Ambulatory Visit: Payer: Self-pay

## 2019-10-04 NOTE — Patient Instructions (Signed)
Goals Addressed            This Visit's Progress   . Patient Stated   On track    Increase safety and independence getting up and down from commodes in her home.  Problem Solve - toilet is too low, nothing to hold onto to help getting up. Solutions:  increase height of toilet, install grabbars     . patient stated   On track    Improve ease and independence getting up and down from chairs.  Problem solved through difficulty getting up and down from chair - due to low height of chair and pain in knees. Solutions:  plywood under cushions Patient attempted modification and can now transfer with increased safety and ease     . Patient Stated   On track    Increase independence and safety getting in and out of shower. ACTION PLANNING - FUNCTIONAL MOBILITY Target Problem Area:  difficult getting in and out of shower   Why Problem May Occur: Pain in left knee makes it difficult to lift leg over tub wall. Temporary grab bar is affixed to tub ledge, not an ideal location to use for transfers.       Target Goal:  Get in and out of shower with increased ease and safety.      STRATEGIES Saving Your Energy: DO: DON'T:  Take breaks    Raise the height of surfaces    Take frequent rests. Just taking 15 minutes in a comfortable chair before becoming fatigued may help to restore your energy   Remove tripping hazards     Other    Modifying your home environment and making it safe: DO: DON'T:  Install grab bars in the bathroom Hold onto unsafe surfaces (towel racks, shower curtains, soap dishes, etc)  Remove or strongly secure throw rugs    Provide adequate lighting Use dim lights or lights that cast a lot of shadows  Other use a shower seat when bathing and to help with transfer.   Step over tub wall   Other    Simplifying the way you set up tasks or daily routines: DO: DON'T:  Move slowly Rush during transfers or walking   Other sit to wash and dry your body   Other     Practice It is important to practice the strategies so we can determine if they will be effective in helping to reach your goal. Follow these specific recommendations: 1.  Use shower seat to assist with transfer in and out of tub.  To get in tub:  back up to tub edge, reach back and sit down onto shower seat.  Swing left leg into tub and then right leg into tub.  To get out of tub, swing right leg out of tub, then left leg.  Use grab bar to pull self up from seated position. 2.  Use grab bars to balance while standing up in shower to bathe, or sit to bathe on shower seat.   3.  Use grab bars in kitchen and sunroom to assist with stepping up and down into various levels of your home.   4.  Use grab bars to assit with tanding up from toilet in your master bathroom.    If a strategy does not work the first time, try it again and again (and maybe again). We may make some changes over the next few sessions, based on how they work.   Vangie Bicker, [place], OTR/L [phone]

## 2019-10-04 NOTE — Patient Outreach (Signed)
Aging Gracefully Program  OT Follow-Up Visit  10/04/2019  Gabriela Jefferson 11-13-1931 [id number]  Visit:  3- Third Visit  Start Time:  1515 End Time:  1615 Total Minutes:  60   Readiness to Change Score :  Readiness to Change Score: 10   Patient Education: Education Provided: Yes Education Details: educated provided on technique to use current shower seat by backing up to tub, reaching back and sitting down on seat and then swinging legs into tub from a seated position.  patient attempted with good success. Person(s) Educated: Patient Comprehension: Verbalized Understanding, Returned Demonstration  Goals:  Goals Addressed            This Visit's Progress   . Patient Stated   On track    Increase safety and independence getting up and down from commodes in her home.  Problem Solve - toilet is too low, nothing to hold onto to help getting up. Solutions:  increase height of toilet, install grabbars     . patient stated   On track    Improve ease and independence getting up and down from chairs.  Problem solved through difficulty getting up and down from chair - due to low height of chair and pain in knees. Solutions:  plywood under cushions Patient attempted modification and can now transfer with increased safety and ease     . Patient Stated   On track    Increase independence and safety getting in and out of shower. ACTION PLANNING - FUNCTIONAL MOBILITY Target Problem Area:  difficult getting in and out of shower   Why Problem May Occur: Pain in left knee makes it difficult to lift leg over tub wall. Temporary grab bar is affixed to tub ledge, not an ideal location to use for transfers.       Target Goal:  Get in and out of shower with increased ease and safety.      STRATEGIES Saving Your Energy: DO: DON'T:  Take breaks    Raise the height of surfaces    Take frequent rests. Just taking 15 minutes in a comfortable chair before becoming fatigued may  help to restore your energy   Remove tripping hazards     Other    Modifying your home environment and making it safe: DO: DON'T:  Install grab bars in the bathroom Hold onto unsafe surfaces (towel racks, shower curtains, soap dishes, etc)  Remove or strongly secure throw rugs    Provide adequate lighting Use dim lights or lights that cast a lot of shadows  Other use a shower seat when bathing and to help with transfer.   Step over tub wall   Other    Simplifying the way you set up tasks or daily routines: DO: DON'T:  Move slowly Rush during transfers or walking   Other sit to wash and dry your body   Other    Practice It is important to practice the strategies so we can determine if they will be effective in helping to reach your goal. Follow these specific recommendations: 1.  Use shower seat to assist with transfer in and out of tub.  To get in tub:  back up to tub edge, reach back and sit down onto shower seat.  Swing left leg into tub and then right leg into tub.  To get out of tub, swing right leg out of tub, then left leg.  Use grab bar to pull self up from seated position. 2.  Use  grab bars to balance while standing up in shower to bathe, or sit to bathe on shower seat.   3.  Use grab bars in kitchen and sunroom to assist with stepping up and down into various levels of your home.   4.  Use grab bars to assit with tanding up from toilet in your master bathroom.    If a strategy does not work the first time, try it again and again (and maybe again). We may make some changes over the next few sessions, based on how they work.   Shirlean Mylar, MHA, OTR/L [phone]           Post Clinical Reasoning: Client Action (Goal) Two Interventions: Improve independence getting up and down from chairs Did Client Try?: Yes Targeted Problem Area Status: A Lot Better Client Action (Goal) Three Interventions: increase independence getting in and out of shower. Did Client Try?:  Yes Targeted Problem Area Status: A Little Better Client Action (Goal) Four Interventions: increase independence getting up and down from commode. Did Client Try?: Yes Targeted Problem Area Status: A Lot Better Clinician View Of Client Situation:: increase ease with moblity in home - attributed to regular exercises, sleeping much better after implementing changes to time she uses the bathroom and wearing gloves to bed.  may benefit from a shower seat with a back versus the tub transfer bench. Client View Of His/Her Situation:: Patient is very pleased with home modifications, particularly benefiting from the grab bars in and out of kitchen and sun room, as well as all grabbars.  attempted use of tub transfer bench, and it dod not work well due to layout of bathroom. Next Visit Plan:: review all goals, issue "Tips for Aging at Home" book, conclude OT visits.   Shirlean Mylar, MHA, OTR/L [phone]

## 2019-10-06 ENCOUNTER — Other Ambulatory Visit: Payer: Self-pay

## 2019-10-06 NOTE — Patient Outreach (Signed)
Care Coordination:  Placed call to patient to book RN home visit.   PLAN: offered home visit for 10/09/2019 Patient accepted.  Tomasa Rand, RN, BSN, CEN [facility] [employer] [phone]

## 2019-10-09 ENCOUNTER — Other Ambulatory Visit: Payer: Self-pay

## 2019-10-09 NOTE — Patient Instructions (Signed)
Continue your great work on your exercises. We will review your cell phone again at the next visit the end of 1/21.   Happy Holidays!  Tomasa Rand, RN, BSN, CEN [facility] [employer] [phone]

## 2019-10-09 NOTE — Patient Outreach (Signed)
Aging Gracefully Program  RN Visit  10/09/2019  Gabriela Jefferson [birth year] [id number]  Visit:   RN home visit #3  Start Time:   1100 End Time:   1130 Total Minutes:   30  Readiness To Change Score:     Universal RN Interventions: Calendar Distribution: Yes(new 2020-2021 calendar provided to patient.) Exercise Review: Yes Medications: Yes Medication Changes: No Mood: Yes Pain: No PCP Advocacy/Support: No Fall Prevention: Yes Incontinence: No Clinician View Of Client Situation: Gabriela Jefferson is doing great. noted to be walking better in her home. reviewed new safety measures that were installed. Good spirits. Client View Of His/Her Situation: Reports she is feeling stronger with her daiy exercises. Reports no falls.  Reviewed concerns about cell phone again.  no changes in medications, will see foot doctor today as planned.  Healthcare Provider Communication: Did Surveyor, mining With [employer] Provider?: No According to Client, Did PCP Report Communication With An Aging Gracefully RN?: No  Clinician View of Client Situation: Clinician View Of Client Situation: Gabriela Jefferson is doing great. noted to be walking better in her home. reviewed new safety measures that were installed. Good spirits. Client's View of His/Her Situation: Client View Of His/Her Situation: Reports she is feeling stronger with her daiy exercises. Reports no falls.  Reviewed concerns about cell phone again.  no changes in medications, will see foot doctor today as planned.  Medication Assessment:Denies any medication changes today.     Session Summary: Patient is doing very well with modification of changes in her home. No falls. Strength has improved.  Reviewed goals with patient. No pain.   Eating and sleeping well. No new concerns today.  PLAN: Will complete last home visit end of Jan 2021. Reviewed progress with patient today.  Goals Addressed            This Visit's Progress   . Paient stated she  wants to be able to use her cell phone without difficulty for texting and calling. (pt-stated)   On track    Reviewed use of cell phone. Reviewed with patient how to power on phone.. Reviewed contact list and how to make a call. Reviewed how to text using her contact list. Provided support and encouraged patient to use phone and experiment.   09/11/2019  reviewed current progress with independent use of cell phone. Patient is feeling more comfortable. Will look for additional information on cell phone for patient.  10/09/2019  during home visit, model number was retrieved from phone to allow research into type of phone to provide patient with more direction on how to use phone.[employer] assurance phone- model number of U693CL.  Patient able to call out on phone but wishes to be able to do more activities with phone.  Gabriela Pavy, RN, BSN, CEN [facility] [employer] [phone]       . Patient Stated   On track    Increase safety and independence getting up and down from commodes in her home.  Problem Solve - toilet is too low, nothing to hold onto to help getting up. Solutions:  increase height of toilet, install grabbars  10/09/2019  Patient reports increased independence with new grab bars around toilet. Gabriela Pavy, RN, BSN, CEN [facility] [employer] [phone]      . patient stated   On track    Improve ease and independence getting up and down from chairs.  Problem solved through difficulty getting up and down from chair - due to low height of  chair and pain in knees. Solutions:  plywood under cushions Patient attempted modification and can now transfer with increased safety and ease   10/09/2019  Patient reports to me that she is able to get up and down much easier with home modifications. Also reports being able to get up and down from toilet easier with grab bars and taller toilet.  Gabriela Pavy, RN, BSN, CEN [facility] [employer] [phone]     .  Patient Stated   On track    Increase independence and safety getting in and out of shower. ACTION PLANNING - FUNCTIONAL MOBILITY Target Problem Area:  difficult getting in and out of shower   Why Problem May Occur: Pain in left knee makes it difficult to lift leg over tub wall. Temporary grab bar is affixed to tub ledge, not an ideal location to use for transfers.       Target Goal:  Get in and out of shower with increased ease and safety.      STRATEGIES Saving Your Energy: DO: DON'T:  Take breaks    Raise the height of surfaces    Take frequent rests. Just taking 15 minutes in a comfortable chair before becoming fatigued may help to restore your energy   Remove tripping hazards     Other    Modifying your home environment and making it safe: DO: DON'T:  Install grab bars in the bathroom Hold onto unsafe surfaces (towel racks, shower curtains, soap dishes, etc)  Remove or strongly secure throw rugs    Provide adequate lighting Use dim lights or lights that cast a lot of shadows  Other use a shower seat when bathing and to help with transfer.   Step over tub wall   Other    Simplifying the way you set up tasks or daily routines: DO: DON'T:  Move slowly Rush during transfers or walking   Other sit to wash and dry your body   Other    Practice It is important to practice the strategies so we can determine if they will be effective in helping to reach your goal. Follow these specific recommendations: 1.  Use shower seat to assist with transfer in and out of tub.  To get in tub:  back up to tub edge, reach back and sit down onto shower seat.  Swing left leg into tub and then right leg into tub.  To get out of tub, swing right leg out of tub, then left leg.  Use grab bar to pull self up from seated position. 2.  Use grab bars to balance while standing up in shower to bathe, or sit to bathe on shower seat.   3.  Use grab bars in kitchen and sunroom to assist with stepping up  and down into various levels of your home.   4.  Use grab bars to assit with tanding up from toilet in your master bathroom.    If a strategy does not work the first time, try it again and again (and maybe again). We may make some changes over the next few sessions, based on how they work.   Shirlean Mylar, MHA, OTR/L [phone]        . Patient Stated   On track    Improve sleep hygiene ACTION PLANNING - CUSTOM  Target Problem Area:  difficulty sleeping through the night   Why Problem May Occur: Patient states she drinks gingerale and water before bed Patient watches tv from 8-10 and  then goes to sleep Goes to the bathroom at 8 Sleeps better if she is warm, and her hands stay cold Wakes up 2 times a night to go to the bathroom  09/11/2019  Patient reports during RN visit her sleep patterns are improving with going to BR prior to going to be to sleep. Reports improved sleeping with gloves on hands. Gabriela Pavy, RN, BSN, CEN [facility] Community Care Coordinator [phone]   10/09/2019  Patient reports sleeping well with no additional concerns at this time with sleeping. Will reassess at next/final home visit.  Gabriela Pavy, RN, BSN, CEN [facility] [phone]       Target Goal:  sleep through the night without awaking several times for going to the bathroom.      Simplifying the way you set up tasks or daily routines: DO:   Restrict fluid intake close to bedtime   Continue to exercise throughout the day   Wear gloves at night to keep hands warm   Go to the bathroom right before bed   If you awake in the night repeat a calming phrase as you drift back to sleep   Use same bed time each night and same routine    Practice It is important to practice the strategies so we can determine if they will be effective in helping to reach your goal. Follow these specific recommendations: 1.  Go to the bathroom at 8 when you get in bed, and then again at 10  right before you go to sleep. 2.    Wear gloves to bed to keep your hands warm.   3.  If a strategy does not work the first time, try it again and again (and maybe again). We may make some changes over the next few sessions, based on how they work.  Shirlean Mylar, MHA, OTR/L [phone]         Gabriela Pavy, RN, BSN, CEN [facility] Coordinator [phone]

## 2019-11-01 ENCOUNTER — Other Ambulatory Visit: Payer: Self-pay | Admitting: Specialist

## 2019-11-02 ENCOUNTER — Other Ambulatory Visit: Payer: Self-pay

## 2019-11-02 NOTE — Patient Outreach (Signed)
Aging Gracefully Program  OT FINAL Visit  11/02/2019  Gabriela Jefferson 1932-08-10 [id number]  Visit:  4- Fourth Visit  Start Time:  1530 End Time:  1620 Total Minutes:  50       Readiness to Change:  Readiness to Change Score: 10      Durable Medical Equipment: Durable Medical Equipment: Shower Chair With Back Durable Medical Equipment Distribution Date: 11/01/19  Patient Education: Education Provided: Yes Education Details: Educated patient on use of "Tips on Aging in Place" booklet.  Patient demonstrated understanding on use of booklet. Person(s) Educated: Patient Comprehension: Verbalized Understanding, Returned Demonstration  Goals: Goals Addressed            This Visit's Progress   . COMPLETED: Patient Stated       Increase safety and independence getting up and down from commodes in her home.  Problem Solve - toilet is too low, nothing to hold onto to help getting up. Solutions:  increase height of toilet, install grabbars  10/09/2019  Patient reports increased independence with new grab bars around toilet. [name], RN, BSN, CEN [facility] [employer] [phone]      . COMPLETED: patient stated       Improve ease and independence getting up and down from chairs.  Problem solved through difficulty getting up and down from chair - due to low height of chair and pain in knees. Solutions:  plywood under cushions Patient attempted modification and can now transfer with increased safety and ease   10/09/2019  Patient reports to me that she is able to get up and down much easier with home modifications. Also reports being able to get up and down from toilet easier with grab bars and taller toilet.  [name], RN, BSN, CEN [facility] [employer] [phone]     . COMPLETED: Patient Stated       Increase independence and safety getting in and out of shower. ACTION PLANNING - FUNCTIONAL MOBILITY Target Problem Area:  difficult  getting in and out of shower   Why Problem May Occur: Pain in left knee makes it difficult to lift leg over tub wall. Temporary grab bar is affixed to tub ledge, not an ideal location to use for transfers.       Target Goal:  Get in and out of shower with increased ease and safety.      STRATEGIES Saving Your Energy: DO: DON'T:  Take breaks    Raise the height of surfaces    Take frequent rests. Just taking 15 minutes in a comfortable chair before becoming fatigued may help to restore your energy   Remove tripping hazards     Other    Modifying your home environment and making it safe: DO: DON'T:  Install grab bars in the bathroom Hold onto unsafe surfaces (towel racks, shower curtains, soap dishes, etc)  Remove or strongly secure throw rugs    Provide adequate lighting Use dim lights or lights that cast a lot of shadows  Other use a shower seat when bathing and to help with transfer.   Step over tub wall   Other    Simplifying the way you set up tasks or daily routines: DO: DON'T:  Move slowly Rush during transfers or walking   Other sit to wash and dry your body   Other    Practice It is important to practice the strategies so we can determine if they will be effective in helping to reach your goal.  Follow these specific recommendations: 1.  Use shower seat to assist with transfer in and out of tub.  To get in tub:  back up to tub edge, reach back and sit down onto shower seat.  Swing left leg into tub and then right leg into tub.  To get out of tub, swing right leg out of tub, then left leg.  Use grab bar to pull self up from seated position. 2.  Use grab bars to balance while standing up in shower to bathe, or sit to bathe on shower seat.   3.  Use grab bars in kitchen and sunroom to assist with stepping up and down into various levels of your home.   4.  Use grab bars to assit with tanding up from toilet in your master bathroom.    If a strategy does not work the  first time, try it again and again (and maybe again). We may make some changes over the next few sessions, based on how they work.   Shirlean Mylar, MHA, OTR/L [phone]        . COMPLETED: Patient Stated       Improve sleep hygiene ACTION PLANNING - CUSTOM  Target Problem Area:  difficulty sleeping through the night   Why Problem May Occur: Patient states she drinks gingerale and water before bed Patient watches tv from 8-10 and then goes to sleep Goes to the bathroom at 8 Sleeps better if she is warm, and her hands stay cold Wakes up 2 times a night to go to the bathroom  09/11/2019  Patient reports during RN visit her sleep patterns are improving with going to BR prior to going to be to sleep. Reports improved sleeping with gloves on hands. [name], RN, BSN, CEN [facility] Coordinator [phone]   10/09/2019  Patient reports sleeping well with no additional concerns at this time with sleeping. Will reassess at next/final home visit.  [name], RN, BSN, CEN [facility] [phone]       Target Goal:  sleep through the night without awaking several times for going to the bathroom.      Simplifying the way you set up tasks or daily routines: DO:   Restrict fluid intake close to bedtime   Continue to exercise throughout the day   Wear gloves at night to keep hands warm   Go to the bathroom right before bed   If you awake in the night repeat a calming phrase as you drift back to sleep   Use same bed time each night and same routine    Practice It is important to practice the strategies so we can determine if they will be effective in helping to reach your goal. Follow these specific recommendations: 1.  Go to the bathroom at 8 when you get in bed, and then again at 10 right before you go to sleep. 2.    Wear gloves to bed to keep your hands warm.   3.  If a strategy does not work the first time, try it again and again  (and maybe again). We may make some changes over the next few sessions, based on how they work.  Shirlean Mylar, MHA, OTR/L [phone]          Post Clinical Reasoning: Client Action (Goal) Three Interventions: Increase independence getting in and out of shower.  Therapist issued shower chair with back, patient used in same fashion as TTB and demonstrated indepdence with  use. Did Client Try?: Yes Targeted Problem Area Status: A Lot Better Clinician View Of Client Situation:: Gabriela Jefferson is moving about her home with a significant increase in ease and confidence.  She is much safer with all transfers, and is safer and more confident with her shower transfer. Client View Of His/Her Situation:: Gabriela Jefferson is extremely pleased with her current state.  She stated "I didnt know I needed you, but I did.  I feel so much safer in my home now." Next Visit Plan:: DC from skilled OT visits this date. Shirlean Mylar, MHA, OTR/L [phone]

## 2019-11-14 ENCOUNTER — Other Ambulatory Visit: Payer: Self-pay

## 2019-11-14 NOTE — Patient Outreach (Signed)
Care Coordination:  Placed call to patient to offered last home visit. Patient answered and reports she is doing well. Offered home visit for 11/16/2019 at 1pm and patient has agreed.  PLAN: home visit planned for 11/16/2019  Tomasa Rand, RN, BSN, [place] Coordinator [phone]

## 2019-11-16 ENCOUNTER — Other Ambulatory Visit: Payer: Self-pay

## 2019-11-16 NOTE — Patient Outreach (Signed)
Aging Gracefully Program  RN Visit  11/16/2019  Gabriela Jefferson 12-30-1931 [id number]  Visit:   RN 4th home visit  Start Time:   1pm End Time:   1:35pm Total Minutes:   35 minutes  Readiness To Change Score:  Readiness to Change Score: 10  Universal RN Interventions: Calendar Distribution: Yes Exercise Review: Yes Medications: Yes Medication Changes: Yes Mood: Yes Pain: No PCP Advocacy/Support: No Fall Prevention: Yes Clinician View Of Client Situation: Onserved patient walking through her home in a more confident way.  Using grab bars when stepping into or out of uneven floors.  Observed more ease in getting up and down from sofa. Client View Of His/Her Situation: Patient reports she is doing great. Reports such an improvement in her quality of life ad reports she did not know what a difference this program could make for her.  Healthcare Provider Communication: Did Higher education careers adviser With [employer] Provider?: No  Clinician View of Client Situation: Clinician View Of Client Situation: Onserved patient walking through her home in a more confident way.  Using grab bars when stepping into or out of uneven floors.  Observed more ease in getting up and down from sofa. Client's View of His/Her Situation: Client View Of His/Her Situation: Patient reports she is doing great. Reports such an improvement in her quality of life ad reports she did not know what a difference this program could make for her.  Medication Assessment: patient denies any changes in medications. Reports she is managing her medications without difficulty.         Session Summary: Arrived to see patient and the weatherizing team was finishing up patients home. Patient reports new insulation and door sweeps.  Patient reports she continues to do her exercises daily and can tell that she is stronger. Denies any new problems or concerns today. Reports she is so thankful for the aging gracefully program and she  reports she did not know how much she needed the help.  PLAN:All goals are met and case is completed. In basket message sent to care management assistant to provide post program evaluation.   Tomasa Rand, RN, BSN, CEN [facility] [employer] [phone]

## 2019-11-16 NOTE — Patient Instructions (Signed)
Outpatient Encounter Medications as of 11/16/2019  Medication Sig Note  . acetaminophen (TYLENOL) 325 MG tablet Take 2 tablets (650 mg total) by mouth every 6 (six) hours.   . bacitracin ointment Apply topically 2 (two) times daily. (Patient not taking: Reported on 08/11/2019)   . cholecalciferol (VITAMIN D) 1000 UNITS tablet Take 1,000 Units by mouth daily.    Marland Kitchen levothyroxine (SYNTHROID) 50 MCG tablet Take 50 mcg by mouth daily before breakfast. Take Tuesday, Thursday and Saturday.   . levothyroxine (SYNTHROID, LEVOTHROID) 100 MCG tablet Take 100 mcg by mouth daily before breakfast. Take 100 mcg on M, W, F, Sunday.   . losartan (COZAAR) 50 MG tablet Take 25 mg by mouth daily. Take a half tablet daily   . metFORMIN (GLUCOPHAGE) 500 MG tablet Take 1,000 mg by mouth daily.  08/11/2019: Takes at lunch time  . polyethylene glycol (MIRALAX / GLYCOLAX) packet Take 17 g by mouth daily. 08/11/2019: Patient takes 1 teaspoon of clear lax daily.  . rosuvastatin (CRESTOR) 10 MG tablet Take 10 mg by mouth daily.   . vitamin B-12 (CYANOCOBALAMIN) 100 MCG tablet Take 100 mcg by mouth daily.     No facility-administered encounter medications on file as of 11/16/2019.    Keep up the good work. Call your medical doctor for any problems or concerns..  Continue your home exercises routine. Be mindful for fall prevention.  Someone will contact you for a survey within a month. Take care!!!  Tomasa Rand, RN, BSN, CEN [facility] [employer] [phone]

## 2019-12-04 ENCOUNTER — Ambulatory Visit: Payer: Medicare Other

## 2019-12-18 ENCOUNTER — Other Ambulatory Visit: Payer: Self-pay

## 2019-12-18 NOTE — Patient Outreach (Signed)
2nd evaluation survey completed.   Interviewer will send follow up to Gabriela Jefferson with [facility].  [place] Management Assistant [phone]

## 2020-04-23 ENCOUNTER — Other Ambulatory Visit: Payer: Self-pay

## 2020-04-23 NOTE — Patient Outreach (Signed)
Aging Gracefully Program  04/23/2020  Gabriela Jefferson 07-09-1932 [id number]  [facility] Evaluation Interviewer attempted to call patient on today regarding Aging Gracefully final evaluation. No answer from patient after multiple rings.  Will attempt to reach patient at later date.  [place] Management Assistant [phone]

## 2020-04-25 ENCOUNTER — Other Ambulatory Visit: Payer: Self-pay

## 2020-04-25 NOTE — Patient Outreach (Signed)
Aging Gracefully Program  04/25/2020  SANDIA PFUND [birth year] [id number]  [facility] Evaluation Interviewer made contact with patient. Aging Gracefully final survey completed.   Interviewer will send follow up to [place] at [facility].  [place] Management Assistant [phone]

## 2023-07-27 ENCOUNTER — Other Ambulatory Visit: Payer: Self-pay

## 2023-07-27 ENCOUNTER — Observation Stay (HOSPITAL_BASED_OUTPATIENT_CLINIC_OR_DEPARTMENT_OTHER)
Admission: EM | Admit: 2023-07-27 | Discharge: 2023-07-29 | Disposition: A | Discharge status: Skilled Nursing Facility | Payer: Medicare Other | Attending: Internal Medicine | Admitting: Internal Medicine

## 2023-07-27 ENCOUNTER — Emergency Department (HOSPITAL_BASED_OUTPATIENT_CLINIC_OR_DEPARTMENT_OTHER): Payer: Medicare Other

## 2023-07-27 ENCOUNTER — Encounter (HOSPITAL_BASED_OUTPATIENT_CLINIC_OR_DEPARTMENT_OTHER): Payer: Self-pay | Admitting: Urology

## 2023-07-27 DIAGNOSIS — Z9181 History of falling: Secondary | ICD-10-CM | POA: Diagnosis not present

## 2023-07-27 DIAGNOSIS — Z79899 Other long term (current) drug therapy: Secondary | ICD-10-CM | POA: Diagnosis not present

## 2023-07-27 DIAGNOSIS — E1122 Type 2 diabetes mellitus with diabetic chronic kidney disease: Secondary | ICD-10-CM | POA: Diagnosis not present

## 2023-07-27 DIAGNOSIS — I129 Hypertensive chronic kidney disease with stage 1 through stage 4 chronic kidney disease, or unspecified chronic kidney disease: Secondary | ICD-10-CM | POA: Diagnosis not present

## 2023-07-27 DIAGNOSIS — E039 Hypothyroidism, unspecified: Secondary | ICD-10-CM | POA: Diagnosis not present

## 2023-07-27 DIAGNOSIS — M6281 Muscle weakness (generalized): Secondary | ICD-10-CM | POA: Insufficient documentation

## 2023-07-27 DIAGNOSIS — N1832 Chronic kidney disease, stage 3b: Secondary | ICD-10-CM | POA: Diagnosis not present

## 2023-07-27 DIAGNOSIS — S32018A Other fracture of first lumbar vertebra, initial encounter for closed fracture: Principal | ICD-10-CM

## 2023-07-27 DIAGNOSIS — S32019A Unspecified fracture of first lumbar vertebra, initial encounter for closed fracture: Principal | ICD-10-CM | POA: Diagnosis present

## 2023-07-27 DIAGNOSIS — R2681 Unsteadiness on feet: Secondary | ICD-10-CM | POA: Insufficient documentation

## 2023-07-27 DIAGNOSIS — Z7984 Long term (current) use of oral hypoglycemic drugs: Secondary | ICD-10-CM | POA: Insufficient documentation

## 2023-07-27 DIAGNOSIS — W07XXXA Fall from chair, initial encounter: Secondary | ICD-10-CM | POA: Insufficient documentation

## 2023-07-27 DIAGNOSIS — E785 Hyperlipidemia, unspecified: Secondary | ICD-10-CM

## 2023-07-27 DIAGNOSIS — I10 Essential (primary) hypertension: Secondary | ICD-10-CM

## 2023-07-27 LAB — CBC WITH DIFFERENTIAL/PLATELET
Abs Immature Granulocytes: 0.03 10*3/uL (ref 0.00–0.07)
Basophils Absolute: 0 10*3/uL (ref 0.0–0.1)
Basophils Relative: 1 %
Eosinophils Absolute: 0 10*3/uL (ref 0.0–0.5)
Eosinophils Relative: 0 %
HCT: 34 % — ABNORMAL LOW (ref 36.0–46.0)
Hemoglobin: 11.2 g/dL — ABNORMAL LOW (ref 12.0–15.0)
Immature Granulocytes: 0 %
Lymphocytes Relative: 15 %
Lymphs Abs: 1.2 10*3/uL (ref 0.7–4.0)
MCH: 29.4 pg (ref 26.0–34.0)
MCHC: 32.9 g/dL (ref 30.0–36.0)
MCV: 89.2 fL (ref 80.0–100.0)
Monocytes Absolute: 0.6 10*3/uL (ref 0.1–1.0)
Monocytes Relative: 8 %
Neutro Abs: 5.9 10*3/uL (ref 1.7–7.7)
Neutrophils Relative %: 76 %
Platelets: 207 10*3/uL (ref 150–400)
RBC: 3.81 MIL/uL — ABNORMAL LOW (ref 3.87–5.11)
RDW: 12.9 % (ref 11.5–15.5)
WBC: 7.8 10*3/uL (ref 4.0–10.5)
nRBC: 0 % (ref 0.0–0.2)

## 2023-07-27 LAB — BASIC METABOLIC PANEL
Anion gap: 11 (ref 5–15)
BUN: 23 mg/dL (ref 8–23)
CO2: 23 mmol/L (ref 22–32)
Calcium: 8.9 mg/dL (ref 8.9–10.3)
Chloride: 103 mmol/L (ref 98–111)
Creatinine, Ser: 1.35 mg/dL — ABNORMAL HIGH (ref 0.44–1.00)
GFR, Estimated: 38 mL/min — ABNORMAL LOW (ref >60)
Glucose, Bld: 194 mg/dL — ABNORMAL HIGH (ref 70–99)
Potassium: 4.3 mmol/L (ref 3.5–5.1)
Sodium: 137 mmol/L (ref 135–145)

## 2023-07-27 LAB — CBG MONITORING, ED: Glucose-Capillary: 114 mg/dL — ABNORMAL HIGH (ref 70–99)

## 2023-07-27 MED ORDER — LIDOCAINE 5 % EX PTCH
1.0000 | MEDICATED_PATCH | Freq: Once | CUTANEOUS | Status: AC
  Administered 2023-07-27: 1 via TRANSDERMAL
  Filled 2023-07-27: qty 1

## 2023-07-27 MED ORDER — TRAMADOL HCL 50 MG PO TABS
50.0000 mg | ORAL_TABLET | Freq: Once | ORAL | Status: AC
  Administered 2023-07-27: 50 mg via ORAL
  Filled 2023-07-27: qty 1

## 2023-07-27 MED ORDER — ACETAMINOPHEN 500 MG PO TABS
1000.0000 mg | ORAL_TABLET | Freq: Once | ORAL | Status: AC
  Administered 2023-07-27: 1000 mg via ORAL
  Filled 2023-07-27: qty 2

## 2023-07-27 NOTE — ED Notes (Signed)
Patient transported to CT 

## 2023-07-27 NOTE — Progress Notes (Signed)
Hospitalist Transfer Note:    Nursing staff, Please call [facility] Admits & Consults System-Wide number on Amion [phone]) as soon as patient's arrival, so appropriate admitting provider can evaluate the pt.  Transferring facility: [facility] Requesting provider: Dr. Theresia Lo (EDP at [facility]) Reason for transfer: admission for further evaluation and management of acute L1 inferior endplate fracture.     89 year old female with history of type 2 diabetes mellitus, essential hypertension, acquired hypothyroidism, who presented to [facility] ED complaining of mid low back pain.   The patient reports that her mid low back pain started on Saturday, 07/24/2023 when she had acute onset mid low back pain while attempting to open a heavy window.  She was subsequently evaluated by her PCP for this new onset mid low back pain, for which she was prescribed a unspecified muscle relaxant, which has not offered her significant improvement in pain relief.   Then, today, she experienced a ground-level mechanical fall at home during which she fell to her right side, striking the right portion of her head.  No associated loss of consciousness.  Denies any ensuing headache or neck pain.  She is not on any blood thinners.  In the setting of poor pain control relating to her mid low back pain, she notes that she has been having progressive difficulty performing her ADLs, and also finding it difficult to ambulate from a pain control standpoint, although she denies any acute focal weakness nor any acute focal numbness, paresthesias, including no saddle anesthesia.  Imaging notable for CT head and neck which showed no evidence of acute process.  She also underwent CT lumbar spine which showed evidence of acute inferior L1 endplate fracture.  EDP discussed patient's case with on-call neurosurgery, who recommended TLSO brace and outpatient follow-up in neurosurgery clinic.    Subsequently, I accepted this patient for transfer for  observation to a med-surg bed at [facility] or [facility] (first available) for further work-up and management of acute inferior L1 endplate fracture, including pain control as well as involvement of PT/OT.      Newton Pigg, DO Hospitalist

## 2023-07-27 NOTE — ED Notes (Signed)
[facility] ortho called regarding TLSO brace. Brace to be delivered to [facility]

## 2023-07-27 NOTE — Progress Notes (Signed)
Orthopedic Tech Progress Note Patient Details:  Gabriela Jefferson 10/30/1931 [id number]  Patient ID: Gabriela Jefferson, female   DOB: November 01, 1931, 89 y.o.   MRN: [id number] [facility] contacted for TLSO brace. Darleen Crocker 07/27/2023, 8:09 PM

## 2023-07-27 NOTE — ED Triage Notes (Signed)
Per [facility] pt fell standing from chair, hit head on right parietal region on table, small bump noted  Not on blood thinners States injured back on Saturday and was put on muscle relaxer \ States continued and worsening lower back pain

## 2023-07-27 NOTE — ED Notes (Signed)
Pt unable to sit up on edge of the bed due to increased pain. Pt and family would like pt to be admitted to facility due to decreased mobility. Theresia Lo DO made aware

## 2023-07-27 NOTE — ED Provider Notes (Signed)
[place] EMERGENCY DEPARTMENT AT [facility] Provider Note   CSN: [id number] Arrival date & time: 07/27/23  1542     History  Chief Complaint  Patient presents with   Marletta Lor    Gabriela Jefferson is a 89 y.o. female.  Patient is an 89 year old female with a past medical history of hypothyroidism, hypertension and diabetes presenting to the emergency department after a fall.  The patient states that she was sitting in a chair trying to get up and somehow fell to the ground.  She states that she was not feeling lightheaded or dizzy.  She states that she did pull her back while trying to lift a window open over the weekend and has been taking a muscle relaxer so is unsure if that is what made her fall.  She states that she did hit the right side of her head but denies any loss of consciousness.  She denies any headache, nausea or vomiting, numbness or weakness.  The patient states that she is still had significant pain in her back but did not hit her back on the fall.  She denies any saddle anesthesia, loss of bowel or bladder function, dysuria or hematuria.  She denies any blood thinner use.  The history is provided by the patient and a relative.  Fall       Home Medications Prior to Admission medications   Medication Sig Start Date End Date Taking? Authorizing Provider  acetaminophen (TYLENOL) 325 MG tablet Take 2 tablets (650 mg total) by mouth every 6 (six) hours. 01/28/18   Meuth, Brooke A, PA-C  bacitracin ointment Apply topically 2 (two) times daily. Patient not taking: Reported on 08/11/2019 01/28/18   Carlena Bjornstad A, PA-C  cholecalciferol (VITAMIN D) 1000 UNITS tablet Take 1,000 Units by mouth daily.     [provider]  levothyroxine (SYNTHROID) 50 MCG tablet Take 50 mcg by mouth daily before breakfast. Take Tuesday, Thursday and Saturday.    [provider]  levothyroxine (SYNTHROID, LEVOTHROID) 100 MCG tablet Take 100 mcg by mouth daily before  breakfast. Take 100 mcg on M, W, F, Sunday.    [provider]  losartan (COZAAR) 50 MG tablet Take 25 mg by mouth daily. Take a half tablet daily    [provider]  metFORMIN (GLUCOPHAGE) 500 MG tablet Take 1,000 mg by mouth daily.     [provider]  polyethylene glycol (MIRALAX / GLYCOLAX) packet Take 17 g by mouth daily. 01/28/18   Meuth, Brooke A, PA-C  rosuvastatin (CRESTOR) 10 MG tablet Take 10 mg by mouth daily.    [provider]  vitamin B-12 (CYANOCOBALAMIN) 100 MCG tablet Take 100 mcg by mouth daily.     [provider]      Allergies    Oxycodone    Review of Systems   Review of Systems  Physical Exam Updated Vital Signs BP (!) 113/52   Pulse 84   Temp 98.9 F (37.2 C)   Resp 14   Ht 5\' 6"  (1.676 m)   Wt 84.5 kg   SpO2 96%   BMI 30.07 kg/m  Physical Exam Vitals and nursing note reviewed.  Constitutional:      General: She is not in acute distress.    Appearance: Normal appearance.  HENT:     Head: Normocephalic and atraumatic.     Nose: Nose normal.     Mouth/Throat:     Mouth: Mucous membranes are moist.  Pharynx: Oropharynx is clear.  Eyes:     Extraocular Movements: Extraocular movements intact.     Conjunctiva/sclera: Conjunctivae normal.     Pupils: Pupils are equal, round, and reactive to light.  Neck:     Comments: No midline neck tenderness Cardiovascular:     Rate and Rhythm: Normal rate and regular rhythm.     Pulses: Normal pulses.     Heart sounds: Normal heart sounds.  Pulmonary:     Effort: Pulmonary effort is normal.     Breath sounds: Normal breath sounds.  Abdominal:     General: Abdomen is flat.     Palpations: Abdomen is soft.     Tenderness: There is no abdominal tenderness. There is no right CVA tenderness or left CVA tenderness.  Musculoskeletal:        General: Normal range of motion.     Cervical back: Normal range of motion and neck supple.     Comments: Midline lumbar  tenderness to palpation with left greater than right sided paraspinal muscle tenderness No bony tenderness of bilateral upper or lower extremities Negative straight leg raise bilaterally  Skin:    General: Skin is warm and dry.  Neurological:     General: No focal deficit present.     Mental Status: She is alert and oriented to person, place, and time.     Sensory: No sensory deficit.     Motor: No weakness.  Psychiatric:        Mood and Affect: Mood normal.        Behavior: Behavior normal.     ED Results / Procedures / Treatments   Labs (all labs ordered are listed, but only abnormal results are displayed) Labs Reviewed  BASIC METABOLIC PANEL - Abnormal; Notable for the following components:      Result Value   Glucose, Bld 194 (*)    Creatinine, Ser 1.35 (*)    GFR, Estimated 38 (*)    All other components within normal limits  CBC WITH DIFFERENTIAL/PLATELET - Abnormal; Notable for the following components:   RBC 3.81 (*)    Hemoglobin 11.2 (*)    HCT 34.0 (*)    All other components within normal limits  CBG MONITORING, ED - Abnormal; Notable for the following components:   Glucose-Capillary 114 (*)    All other components within normal limits    EKG None  Radiology CT Head Wo Contrast  Result Date: 07/27/2023 CLINICAL DATA:  Head trauma, minor (Age >= 65y); Neck trauma (Age >= 65y); Back trauma, no prior imaging (Age >= 16y) EXAM: CT HEAD WITHOUT CONTRAST CT CERVICAL SPINE WITHOUT CONTRAST CT LUMBAR SPINE WITHOUT CONTRAST TECHNIQUE: Multidetector CT imaging of the head, cervical and lumbar spine was performed following the standard protocol without intravenous contrast. Multiplanar CT image reconstructions of the cervical and lumbar spine were also generated. RADIATION DOSE REDUCTION: This exam was performed according to the departmental dose-optimization program which includes automated exposure control, adjustment of the mA and/or kV according to patient size and/or  use of iterative reconstruction technique. COMPARISON:  None Available. FINDINGS: CT HEAD FINDINGS Brain: No evidence of acute infarction, hemorrhage, hydrocephalus, extra-axial collection or mass lesion/mass effect. Patchy white matter hypodensities are nonspecific but compatible with chronic microvascular ischemic disease. Vascular: Calcific atherosclerosis. Skull: No acute fracture. Sinuses/Orbits: Clear sinuses.  No acute orbital findings. Other: No mastoid effusions. CT CERVICAL SPINE FINDINGS Alignment: No substantial sagittal subluxation.  Broad levocurvature Skull base and vertebrae: No acute fracture.  Osteopenia. Soft tissues and spinal canal: No prevertebral fluid or swelling. No visible canal hematoma. Disc levels: Multilevel degenerative disc disease, greatest at C5-C6 where there is endplate spurring and posterior disc/osteophyte complex. Right foraminal stenosis at this level. Upper chest: Visualized lung apices are clear. CT LUMBAR SPINE FINDINGS Alignment: Grade 1 anterolisthesis of L4 on L5. Vertebral bodies: Linear lucency through the inferior L1 vertebral body with extension to the posterior cortex, compatible with a fracture that is probably acute or subacute. No significant height loss. Mild associated buckling of the posterior cortex without significant canal stenosis. Osteopenia. Disc levels: Severe degenerative change at L4-L5 on the left where there is disc height loss, vacuum disc phenomenon and endplate spurring. Paraspinal: Aorto bi-iliac calcific atherosclerosis. IMPRESSION: CT lumbar spine: 1. Probably acute or subacute fracture involving the L1 inferior endplate with extension to the posterior cortex and slight buckling. No significant height loss. 2. Severe degenerative change at L4-L5 on the left. CT head: No evidence of acute intracranial abnormality. CT cervical spine: 1. No evidence of acute fracture or traumatic malalignment in the cervical spine. 2. Degenerative changes at  C5-C6 where there is right foraminal stenosis. Findings discussed with Dr. Theresia Lo via telephone at 6:56 p.m. Electronically Signed   By: Feliberto Harts M.D.   On: 07/27/2023 18:58   CT Cervical Spine Wo Contrast  Result Date: 07/27/2023 CLINICAL DATA:  Head trauma, minor (Age >= 65y); Neck trauma (Age >= 65y); Back trauma, no prior imaging (Age >= 16y) EXAM: CT HEAD WITHOUT CONTRAST CT CERVICAL SPINE WITHOUT CONTRAST CT LUMBAR SPINE WITHOUT CONTRAST TECHNIQUE: Multidetector CT imaging of the head, cervical and lumbar spine was performed following the standard protocol without intravenous contrast. Multiplanar CT image reconstructions of the cervical and lumbar spine were also generated. RADIATION DOSE REDUCTION: This exam was performed according to the departmental dose-optimization program which includes automated exposure control, adjustment of the mA and/or kV according to patient size and/or use of iterative reconstruction technique. COMPARISON:  None Available. FINDINGS: CT HEAD FINDINGS Brain: No evidence of acute infarction, hemorrhage, hydrocephalus, extra-axial collection or mass lesion/mass effect. Patchy white matter hypodensities are nonspecific but compatible with chronic microvascular ischemic disease. Vascular: Calcific atherosclerosis. Skull: No acute fracture. Sinuses/Orbits: Clear sinuses.  No acute orbital findings. Other: No mastoid effusions. CT CERVICAL SPINE FINDINGS Alignment: No substantial sagittal subluxation.  Broad levocurvature Skull base and vertebrae: No acute fracture.  Osteopenia. Soft tissues and spinal canal: No prevertebral fluid or swelling. No visible canal hematoma. Disc levels: Multilevel degenerative disc disease, greatest at C5-C6 where there is endplate spurring and posterior disc/osteophyte complex. Right foraminal stenosis at this level. Upper chest: Visualized lung apices are clear. CT LUMBAR SPINE FINDINGS Alignment: Grade 1 anterolisthesis of L4 on L5.  Vertebral bodies: Linear lucency through the inferior L1 vertebral body with extension to the posterior cortex, compatible with a fracture that is probably acute or subacute. No significant height loss. Mild associated buckling of the posterior cortex without significant canal stenosis. Osteopenia. Disc levels: Severe degenerative change at L4-L5 on the left where there is disc height loss, vacuum disc phenomenon and endplate spurring. Paraspinal: Aorto bi-iliac calcific atherosclerosis. IMPRESSION: CT lumbar spine: 1. Probably acute or subacute fracture involving the L1 inferior endplate with extension to the posterior cortex and slight buckling. No significant height loss. 2. Severe degenerative change at L4-L5 on the left. CT head: No evidence of acute intracranial abnormality. CT cervical spine: 1. No evidence of acute fracture or traumatic malalignment in the  cervical spine. 2. Degenerative changes at C5-C6 where there is right foraminal stenosis. Findings discussed with Dr. Theresia Lo via telephone at 6:56 p.m. Electronically Signed   By: Feliberto Harts M.D.   On: 07/27/2023 18:58   CT Lumbar Spine Wo Contrast  Result Date: 07/27/2023 CLINICAL DATA:  Head trauma, minor (Age >= 65y); Neck trauma (Age >= 65y); Back trauma, no prior imaging (Age >= 16y) EXAM: CT HEAD WITHOUT CONTRAST CT CERVICAL SPINE WITHOUT CONTRAST CT LUMBAR SPINE WITHOUT CONTRAST TECHNIQUE: Multidetector CT imaging of the head, cervical and lumbar spine was performed following the standard protocol without intravenous contrast. Multiplanar CT image reconstructions of the cervical and lumbar spine were also generated. RADIATION DOSE REDUCTION: This exam was performed according to the departmental dose-optimization program which includes automated exposure control, adjustment of the mA and/or kV according to patient size and/or use of iterative reconstruction technique. COMPARISON:  None Available. FINDINGS: CT HEAD FINDINGS Brain: No  evidence of acute infarction, hemorrhage, hydrocephalus, extra-axial collection or mass lesion/mass effect. Patchy white matter hypodensities are nonspecific but compatible with chronic microvascular ischemic disease. Vascular: Calcific atherosclerosis. Skull: No acute fracture. Sinuses/Orbits: Clear sinuses.  No acute orbital findings. Other: No mastoid effusions. CT CERVICAL SPINE FINDINGS Alignment: No substantial sagittal subluxation.  Broad levocurvature Skull base and vertebrae: No acute fracture.  Osteopenia. Soft tissues and spinal canal: No prevertebral fluid or swelling. No visible canal hematoma. Disc levels: Multilevel degenerative disc disease, greatest at C5-C6 where there is endplate spurring and posterior disc/osteophyte complex. Right foraminal stenosis at this level. Upper chest: Visualized lung apices are clear. CT LUMBAR SPINE FINDINGS Alignment: Grade 1 anterolisthesis of L4 on L5. Vertebral bodies: Linear lucency through the inferior L1 vertebral body with extension to the posterior cortex, compatible with a fracture that is probably acute or subacute. No significant height loss. Mild associated buckling of the posterior cortex without significant canal stenosis. Osteopenia. Disc levels: Severe degenerative change at L4-L5 on the left where there is disc height loss, vacuum disc phenomenon and endplate spurring. Paraspinal: Aorto bi-iliac calcific atherosclerosis. IMPRESSION: CT lumbar spine: 1. Probably acute or subacute fracture involving the L1 inferior endplate with extension to the posterior cortex and slight buckling. No significant height loss. 2. Severe degenerative change at L4-L5 on the left. CT head: No evidence of acute intracranial abnormality. CT cervical spine: 1. No evidence of acute fracture or traumatic malalignment in the cervical spine. 2. Degenerative changes at C5-C6 where there is right foraminal stenosis. Findings discussed with Dr. Theresia Lo via telephone at 6:56 p.m.  Electronically Signed   By: Feliberto Harts M.D.   On: 07/27/2023 18:58    Procedures Procedures    Medications Ordered in ED Medications  lidocaine (LIDODERM) 5 % 1-3 patch (1 patch Transdermal Patch Applied 07/27/23 1709)  traMADol (ULTRAM) tablet 50 mg (has no administration in time range)  acetaminophen (TYLENOL) tablet 1,000 mg (1,000 mg Oral Given 07/27/23 1709)    ED Course/ Medical Decision Making/ A&P Clinical Course as of 07/27/23 2301  Tue Jul 27, 2023  1857 Received call from radiology for acute-subacute L1 fracture, no traumatic injury on [facility] or C-spine. [VK]  1947 I spoke with [facility] neurosurgery who recommended TSLO brace and outpatient follow up. [VK]  2228 Patient received TSLO brace and had too much pain to ambulate steadily. Plan will be admission for pain control and PT eval. [VK]  2259 Mild increased Cr otherwise labs at baseline. Hospitalist will be called for admission. [VK]  Clinical Course User Index [VK] Rexford Maus, DO                                 Medical Decision Making This patient presents to the ED with chief complaint(s) of fall, back pain with pertinent past medical history of hypothyroidism, hypertension, diabetes which further complicates the presenting complaint. The complaint involves an extensive differential diagnosis and also carries with it a high risk of complications and morbidity.    The differential diagnosis includes due to patient's age with head trauma concern for ICH, mass effect, cervical spine fracture also concern for lumbar fracture with midline back tenderness, no neurologic deficits, cauda equina or loss of bowel or bladder function making cauda equina unlikely, no urinary symptoms and no CVA tenderness making pyelonephritis or nephrolithiasis unlikely, considering muscle strain or spasm, no other traumatic injury seen on exam, no presyncopal symptoms making a syncopal fall unlikely  Additional history  obtained: Additional history obtained from family Records reviewed N/A  ED Course and Reassessment: On patient's arrival she was mildly tachycardic and hypertensive but otherwise hemodynamically stable in no acute distress.  Due to patient's fall with her age and head trauma will have head CT and C-spine performed, does have midline lumbar spinal tenderness and will have CT lumbar spine additionally performed.  Patient will be given Tylenol and lidocaine patch for pain control and will be closely reassessed.  Independent labs interpretation:  The following labs were independently interpreted: Mild increased creatinine otherwise at baseline  Independent visualization of imaging: - I independently visualized the following imaging with scope of interpretation limited to determining acute life threatening conditions related to emergency care: CT head/C-spine, CT lumbar spine, which revealed acute T1 fracture  Consultation: - Consulted or discussed management/test interpretation w/ external professional: Neurosurgery, hospitalist  Consideration for admission or further workup: patient requires admission for pain control and PT eval Social Determinants of health: N/A    Amount and/or Complexity of Data Reviewed Labs: ordered. Radiology: ordered.  Risk OTC drugs. Prescription drug management. Decision regarding hospitalization.          Final Clinical Impression(s) / ED Diagnoses Final diagnoses:  Other closed fracture of first lumbar vertebra, initial encounter [facility])    Rx / DC Orders ED Discharge Orders     None         Rexford Maus, DO 07/27/23 2301

## 2023-07-28 DIAGNOSIS — E1122 Type 2 diabetes mellitus with diabetic chronic kidney disease: Secondary | ICD-10-CM | POA: Diagnosis not present

## 2023-07-28 DIAGNOSIS — N1832 Chronic kidney disease, stage 3b: Secondary | ICD-10-CM | POA: Diagnosis not present

## 2023-07-28 DIAGNOSIS — E039 Hypothyroidism, unspecified: Secondary | ICD-10-CM | POA: Diagnosis not present

## 2023-07-28 DIAGNOSIS — S32019A Unspecified fracture of first lumbar vertebra, initial encounter for closed fracture: Secondary | ICD-10-CM | POA: Diagnosis present

## 2023-07-28 DIAGNOSIS — I10 Essential (primary) hypertension: Secondary | ICD-10-CM

## 2023-07-28 DIAGNOSIS — W07XXXA Fall from chair, initial encounter: Secondary | ICD-10-CM | POA: Diagnosis not present

## 2023-07-28 DIAGNOSIS — E785 Hyperlipidemia, unspecified: Secondary | ICD-10-CM

## 2023-07-28 DIAGNOSIS — S32018A Other fracture of first lumbar vertebra, initial encounter for closed fracture: Secondary | ICD-10-CM

## 2023-07-28 DIAGNOSIS — Z79899 Other long term (current) drug therapy: Secondary | ICD-10-CM | POA: Diagnosis not present

## 2023-07-28 DIAGNOSIS — Z7984 Long term (current) use of oral hypoglycemic drugs: Secondary | ICD-10-CM | POA: Diagnosis not present

## 2023-07-28 DIAGNOSIS — I129 Hypertensive chronic kidney disease with stage 1 through stage 4 chronic kidney disease, or unspecified chronic kidney disease: Secondary | ICD-10-CM | POA: Diagnosis not present

## 2023-07-28 LAB — MAGNESIUM: Magnesium: 1.8 mg/dL (ref 1.7–2.4)

## 2023-07-28 LAB — BASIC METABOLIC PANEL
Anion gap: 9 (ref 5–15)
BUN: 20 mg/dL (ref 8–23)
CO2: 24 mmol/L (ref 22–32)
Calcium: 8.6 mg/dL — ABNORMAL LOW (ref 8.9–10.3)
Chloride: 105 mmol/L (ref 98–111)
Creatinine, Ser: 1.27 mg/dL — ABNORMAL HIGH (ref 0.44–1.00)
GFR, Estimated: 41 mL/min — ABNORMAL LOW (ref >60)
Glucose, Bld: 137 mg/dL — ABNORMAL HIGH (ref 70–99)
Potassium: 3.9 mmol/L (ref 3.5–5.1)
Sodium: 138 mmol/L (ref 135–145)

## 2023-07-28 LAB — CBC
HCT: 31.8 % — ABNORMAL LOW (ref 36.0–46.0)
Hemoglobin: 10.3 g/dL — ABNORMAL LOW (ref 12.0–15.0)
MCH: 29.1 pg (ref 26.0–34.0)
MCHC: 32.4 g/dL (ref 30.0–36.0)
MCV: 89.8 fL (ref 80.0–100.0)
Platelets: 201 10*3/uL (ref 150–400)
RBC: 3.54 MIL/uL — ABNORMAL LOW (ref 3.87–5.11)
RDW: 12.6 % (ref 11.5–15.5)
WBC: 7.1 10*3/uL (ref 4.0–10.5)
nRBC: 0 % (ref 0.0–0.2)

## 2023-07-28 LAB — GLUCOSE, CAPILLARY
Glucose-Capillary: 130 mg/dL — ABNORMAL HIGH (ref 70–99)
Glucose-Capillary: 145 mg/dL — ABNORMAL HIGH (ref 70–99)
Glucose-Capillary: 164 mg/dL — ABNORMAL HIGH (ref 70–99)
Glucose-Capillary: 149 mg/dL — ABNORMAL HIGH (ref 70–99)

## 2023-07-28 LAB — HEMOGLOBIN A1C
Hgb A1c MFr Bld: 6.9 % — ABNORMAL HIGH (ref 4.8–5.6)
Mean Plasma Glucose: 151.33 mg/dL

## 2023-07-28 LAB — PHOSPHORUS: Phosphorus: 3.8 mg/dL (ref 2.5–4.6)

## 2023-07-28 MED ORDER — HEPARIN SODIUM (PORCINE) 5000 UNIT/ML IJ SOLN
5000.0000 [IU] | Freq: Three times a day (TID) | INTRAMUSCULAR | Status: DC
  Administered 2023-07-28 – 2023-07-29 (×4): 5000 [IU] via SUBCUTANEOUS
  Filled 2023-07-28 (×4): qty 1

## 2023-07-28 MED ORDER — ROSUVASTATIN CALCIUM 5 MG PO TABS
10.0000 mg | ORAL_TABLET | Freq: Every day | ORAL | Status: DC
  Administered 2023-07-28 – 2023-07-29 (×2): 10 mg via ORAL
  Filled 2023-07-28 (×2): qty 2

## 2023-07-28 MED ORDER — LEVOTHYROXINE SODIUM 50 MCG PO TABS
50.0000 ug | ORAL_TABLET | ORAL | Status: DC
  Administered 2023-07-28: 50 ug via ORAL
  Filled 2023-07-28: qty 1

## 2023-07-28 MED ORDER — POLYETHYLENE GLYCOL 3350 17 G PO PACK: 17.0000 g | PACK | Freq: Every day | ORAL | Status: DC | PRN

## 2023-07-28 MED ORDER — VITAMIN B-12 100 MCG PO TABS
100.0000 ug | ORAL_TABLET | Freq: Every day | ORAL | Status: DC
  Administered 2023-07-28 – 2023-07-29 (×2): 100 ug via ORAL
  Filled 2023-07-28 (×2): qty 1

## 2023-07-28 MED ORDER — INSULIN ASPART 100 UNIT/ML IJ SOLN: 0.0000 [IU] | Freq: Every day | INTRAMUSCULAR | Status: DC

## 2023-07-28 MED ORDER — PROCHLORPERAZINE EDISYLATE 10 MG/2ML IJ SOLN: 5.0000 mg | Freq: Four times a day (QID) | INTRAMUSCULAR | Status: DC | PRN

## 2023-07-28 MED ORDER — MELATONIN 5 MG PO TABS: 5.0000 mg | ORAL_TABLET | Freq: Every evening | ORAL | Status: DC | PRN

## 2023-07-28 MED ORDER — VITAMIN D 25 MCG (1000 UNIT) PO TABS
1000.0000 [IU] | ORAL_TABLET | Freq: Every day | ORAL | Status: DC
  Administered 2023-07-29: 1000 [IU] via ORAL
  Filled 2023-07-28: qty 1

## 2023-07-28 MED ORDER — LEVOTHYROXINE SODIUM 100 MCG PO TABS
100.0000 ug | ORAL_TABLET | ORAL | Status: DC
  Administered 2023-07-29: 100 ug via ORAL
  Filled 2023-07-28: qty 1

## 2023-07-28 MED ORDER — OXYCODONE HCL 5 MG PO TABS
5.0000 mg | ORAL_TABLET | Freq: Four times a day (QID) | ORAL | Status: DC | PRN
  Administered 2023-07-29: 5 mg via ORAL
  Filled 2023-07-28: qty 1

## 2023-07-28 MED ORDER — MORPHINE SULFATE (PF) 2 MG/ML IV SOLN
2.0000 mg | INTRAVENOUS | Status: DC | PRN
  Administered 2023-07-28 (×3): 2 mg via INTRAVENOUS
  Filled 2023-07-28 (×3): qty 1

## 2023-07-28 MED ORDER — LEVOTHYROXINE SODIUM 50 MCG PO TABS: 50.0000 ug | ORAL_TABLET | ORAL | Status: DC

## 2023-07-28 MED ORDER — CYCLOBENZAPRINE HCL 10 MG PO TABS: 5.0000 mg | ORAL_TABLET | Freq: Two times a day (BID) | ORAL | Status: DC | PRN

## 2023-07-28 MED ORDER — ACETAMINOPHEN 325 MG PO TABS: 650.0000 mg | ORAL_TABLET | Freq: Four times a day (QID) | ORAL | Status: DC | PRN

## 2023-07-28 MED ORDER — LEVOTHYROXINE SODIUM 100 MCG PO TABS
100.0000 ug | ORAL_TABLET | ORAL | Status: DC
  Filled 2023-07-28: qty 1

## 2023-07-28 MED ORDER — HYDROCHLOROTHIAZIDE 12.5 MG PO TABS
12.5000 mg | ORAL_TABLET | Freq: Every day | ORAL | Status: DC
  Administered 2023-07-29: 12.5 mg via ORAL
  Filled 2023-07-28: qty 1

## 2023-07-28 MED ORDER — INSULIN ASPART 100 UNIT/ML IJ SOLN
0.0000 [IU] | Freq: Three times a day (TID) | INTRAMUSCULAR | Status: DC
  Administered 2023-07-28: 1 [IU] via SUBCUTANEOUS
  Administered 2023-07-28: 2 [IU] via SUBCUTANEOUS
  Administered 2023-07-28: 1 [IU] via SUBCUTANEOUS
  Administered 2023-07-29: 2 [IU] via SUBCUTANEOUS
  Administered 2023-07-29: 1 [IU] via SUBCUTANEOUS

## 2023-07-28 NOTE — [place] FL2 (Addendum)
[place] MEDICAID FL2 LEVEL OF CARE FORM     IDENTIFICATION  Patient Name: Gabriela Jefferson Birthdate: [birth year] Sex: female Admission Date (Current Location): 07/27/2023  [facility] and [place] Number:  Producer, television/film/video and Address:  [facility], [address], [place], [place] [address]      Provider Number: [id number]  Attending Physician Name and Address:  Arnetha Courser, MD  Relative Name and Phone Number:       Current Level of Care: SNF Recommended Level of Care: Skilled Nursing Facility Prior Approval Number:    Date Approved/Denied:   PASRR Number: [id number]  Discharge Plan: SNF    Current Diagnoses: Patient Active Problem List   Diagnosis Date Noted   Closed L1 vertebral fracture (HCC) 07/27/2023   SDH (subdural hematoma) (HCC) 01/24/2018   Dysphagia 05/07/2016   Reflux 05/07/2016   Constipation 05/07/2016   Chest pain 05/06/2016   RLQ PAIN 08/08/2009   CONSTIPATION 02/04/2009   Type I diabetes mellitus with complication, uncontrolled (HCC) 12/26/2008   GASTROPARESIS 12/19/2008   DYSPHAGIA 12/19/2008    Orientation RESPIRATION BLADDER Height & Weight     Self, Time, Situation, Place  Normal Continent Weight: 186 lb 4.6 oz (84.5 kg) Height:  5\' 6"  (167.6 cm)  BEHAVIORAL SYMPTOMS/MOOD NEUROLOGICAL BOWEL NUTRITION STATUS      Continent Diet (Heart Healthy Carb Modified)  AMBULATORY STATUS COMMUNICATION OF NEEDS Skin   Extensive Assist Verbally Normal                       Personal Care Assistance Level of Assistance  Feeding, Bathing, Dressing Bathing Assistance: Maximum assistance Feeding assistance: Limited assistance Dressing Assistance: Maximum assistance     Functional Limitations Info  Hearing   Hearing Info: Impaired      SPECIAL CARE FACTORS FREQUENCY  PT (By licensed PT), OT (By licensed OT)     PT Frequency: 5x/week OT Frequency: 5x/week            Contractures      Additional  Factors Info  Code Status, Allergies Code Status Info: Full Allergies Info: Oxycontin (Oxycodone)           Current Medications (07/28/2023):  This is the current hospital active medication list Current Facility-Administered Medications  Medication Dose Route Frequency Provider Last Rate Last Admin   acetaminophen (TYLENOL) tablet 650 mg  650 mg Oral Q6H PRN Dow Adolph N, DO       heparin injection 5,000 Units  5,000 Units Subcutaneous Q8H Dow Adolph N, DO   5,000 Units at 07/28/23 1325   insulin aspart (novoLOG) injection 0-5 Units  0-5 Units Subcutaneous QHS Hall, Carole N, DO       insulin aspart (novoLOG) injection 0-9 Units  0-9 Units Subcutaneous TID WC Dow Adolph N, DO   1 Units at 07/28/23 1240   [START ON 07/29/2023] levothyroxine (SYNTHROID) tablet 100 mcg  100 mcg Oral Once per day on Tuesday Thursday Arnetha Courser, MD       levothyroxine (SYNTHROID) tablet 50 mcg  50 mcg Oral Once per day on Sunday Monday Wednesday Friday Saturday Arnetha Courser, MD   50 mcg at 07/28/23 0631   melatonin tablet 5 mg  5 mg Oral QHS PRN Dow Adolph N, DO       morphine (PF) 2 MG/ML injection 2 mg  2 mg Intravenous Q3H PRN Dow Adolph N, DO   2 mg at 07/28/23 5956   oxyCODONE (  Oxy IR/ROXICODONE) immediate release tablet 5 mg  5 mg Oral Q6H PRN Dow Adolph N, DO       polyethylene glycol (MIRALAX / GLYCOLAX) packet 17 g  17 g Oral Daily PRN Dow Adolph N, DO       prochlorperazine (COMPAZINE) injection 5 mg  5 mg Intravenous Q6H PRN Margo Aye, Carole N, DO       rosuvastatin (CRESTOR) tablet 10 mg  10 mg Oral Daily Dow Adolph N, DO   10 mg at 07/28/23 7989   vitamin B-12 (CYANOCOBALAMIN) tablet 100 mcg  100 mcg Oral Daily Darlin Drop, DO   100 mcg at 07/28/23 2119     Discharge Medications: Please see discharge summary for a list of discharge medications.  Relevant Imaging Results:  Relevant Lab Results:   Additional Information SS# [id number]  Antion Felipa [place], Student-Social  Work

## 2023-07-28 NOTE — ED Notes (Signed)
[facility] at bedside, report given.

## 2023-07-28 NOTE — Progress Notes (Signed)
Progress Note   Patient: Gabriela Jefferson:[id number] DOB: [birth year] DOA: 07/27/2023     0 DOS: the patient was seen and examined on 07/28/2023   Brief hospital course: Taken from H&P.  Gabriela Jefferson is a 89 y.o. female with medical history significant for hypertension, hypothyroidism, type 2 diabetes, GERD, CKD 3B, who initially presented to [facility] ED after a mechanical fall.  She fell over the weekend while trying to open a window, since then she was having back pain.  She fell again on the day of admission while standing from a chair, hit her head, no loss of consciousness.  On arrival to ED CT head with no acute intracranial abnormality.  CT lumbar spine revealed acute/subacute fracture involving L1 inferior endplate with extension to the posterior cortex and slight buckling.  Neurosurgery was consulted from ED, they recommend pain control, TLSO brace and outpatient follow-up.  10/9: Vital and labs stable stable.  A1c of 6.9.  Continue to have pain with ambulation, PT and OT are recommending SNF.  TOC was consulted    Assessment and Plan: * Closed L1 vertebral fracture (HCC) Secondary to mechanical fall. Neurosurgery is recommending TLSO and pain management PT OT are recommending SNF -Continue with pain management  Type 2 diabetes mellitus with chronic kidney disease, without long-term current use of insulin (HCC) Patient was on metformin at home, seems well-controlled with A1c of 6.9. -Continue with SSI  Chronic kidney disease, stage 3b (HCC) Renal function seems stable at baseline. -Monitor renal function -Avoid nephrotoxins  Essential hypertension Blood pressure currently within goal. -Restarting home hydrochlorothiazide -Holding losartan which can be added as needed  Hypothyroidism -Continue Synthroid  Hyperlipidemia -Continue statin   Subjective: Patient was seen and examined today.  No pain when she was lying down but unable to ambulate due to  pain.  Physical Exam: Vitals:   07/28/23 0130 07/28/23 0320 07/28/23 0812 07/28/23 1108  BP: (!) 152/62 (!) 149/55 (!) 115/39 (!) 113/45  Pulse: 80 77 81 77  Resp: 18  16 16   Temp: 98.7 F (37.1 C) 98.2 F (36.8 C) 98.1 F (36.7 C) 98.4 F (36.9 C)  TempSrc: Oral Axillary Oral Oral  SpO2: 99% 97% 99% 97%  Weight:      Height:       General.  Frail elderly lady, in no acute distress. Pulmonary.  Lungs clear bilaterally, normal respiratory effort. CV.  Regular rate and rhythm, no JVD, rub or murmur. Abdomen.  Soft, nontender, nondistended, BS positive. CNS.  Alert and oriented .  No focal neurologic deficit. Extremities.  No edema, no cyanosis, pulses intact and symmetrical. Psychiatry.  Judgment and insight appears normal.   Data Reviewed: Prior data reviewed  Family Communication: Discussed with 2 sons at bedside  Disposition: Status is: Observation The patient remains OBS appropriate and will d/c before 2 midnights.  Planned Discharge Destination: Skilled nursing facility  DVT prophylaxis.  Subcu heparin Time spent:  minutes  This record has been created using Conservation officer, historic buildings. Errors have been sought and corrected,but may not always be located. Such creation errors do not reflect on the standard of care.   Author: Arnetha Courser, MD 07/28/2023 2:47 PM  For on call review [email or web address].

## 2023-07-28 NOTE — Evaluation (Signed)
Physical Therapy Evaluation Patient Details Name: Gabriela Jefferson MRN: [id number] DOB: 1931/12/19 Today's Date: 07/28/2023  History of Present Illness  89 y.o. female admitted 10/8 with back pain following 2 falls at home. CT lumbar spine revealed acute or subacute fracture involving the L1 inferior endplate with extension to the posterior cortex and slight buckling.  PMHx:  hypertension, hypothyroidism, type 2 diabetes, GERD, CKD 3B.  Clinical Impression  Pt admitted with above diagnosis. Mod I prior to admission and active. During PT evaluation pt required up to mod assist with transfers and bed mobility. Ambulates with min assist and strong reliance on RW for support. Reviewed precautions and TLSO use. Sons present and very supportive. Open to post-acute rehab as she will need 24/7 assist initially and will greatly benefit from <3hr skilled rehab to regain functional independence.  Pt currently with functional limitations due to the deficits listed below (see PT Problem List). Pt will benefit from acute skilled PT to increase their independence and safety with mobility to allow discharge.          If plan is discharge home, recommend the following: A lot of help with walking and/or transfers;A lot of help with bathing/dressing/bathroom;Assistance with cooking/housework;Assist for transportation;Help with stairs or ramp for entrance   Can travel by private vehicle   Yes    Equipment Recommendations None recommended by PT     Functional Status Assessment Patient has had a recent decline in their functional status and demonstrates the ability to make significant improvements in function in a reasonable and predictable amount of time.     Precautions / Restrictions Precautions Precautions: Back;Fall Precaution Booklet Issued: Yes (comment) Precaution Comments: Reviewed precautions Required Braces or Orthoses: Spinal Brace Spinal Brace: Thoracolumbosacral orthotic;Applied in sitting  position Restrictions Weight Bearing Restrictions: No      Mobility  Bed Mobility Overal bed mobility: Needs Assistance Bed Mobility: Rolling, Sit to Sidelying Rolling: Min assist, Used rails       Sit to sidelying: Mod assist, Used rails General bed mobility comments: Mod assist for LE support into bed, guidance for trunk, max VC for technique to maintain neutral lumbar alignment. Min assist to roll, needs HOB elevated quickly due to back pain when flat.    Transfers Overall transfer level: Needs assistance Equipment used: Rolling walker (2 wheels) Transfers: Sit to/from Stand Sit to Stand: Mod assist           General transfer comment: Mod assist for boost to stand, required several tries with cues for hand placement and technique to rise from recliner.    Ambulation/Gait Ambulation/Gait assistance: Min assist Gait Distance (Feet): 65 Feet Assistive device: Rolling walker (2 wheels) Gait Pattern/deviations: Step-through pattern, Decreased stride length, Trunk flexed, Leaning posteriorly Gait velocity: decr Gait velocity interpretation: <1.31 ft/sec, indicative of household ambulator Pre-gait activities: static march General Gait Details: Min assist intermittently for balance and with RW in congested areas. Initially with slight posterior instability. Trunk flexed, requires cues for increased vertical posture. No overt buckling during bout. Educated on RW use and proximity.  Stairs            Wheelchair Mobility     Tilt Bed    Modified Rankin (Stroke Patients Only)       Balance Overall balance assessment: Needs assistance, History of Falls Sitting-balance support: Feet supported, No upper extremity supported Sitting balance-Leahy Scale: Fair Sitting balance - Comments: Can sit EOB and edge of chair without UE support but is effortful.   Standing  balance support: Bilateral upper extremity supported, During functional activity, Reliant on assistive  device for balance Standing balance-Leahy Scale: Poor                               Pertinent Vitals/Pain Pain Assessment Pain Assessment: Faces Faces Pain Scale: Hurts whole lot Pain Location: back with transfer Pain Descriptors / Indicators: Aching, Grimacing, Discomfort Pain Intervention(s): Monitored during session, Repositioned, Limited activity within patient's tolerance, Premedicated before session    Home Living Family/patient expects to be discharged to:: Private residence Living Arrangements: Children (adult daughter (works and is gone during the day)) Available Help at Discharge: Family;Available PRN/intermittently;Available 24 hours/day (adult son is retired and states that he can assist as needed) Type of Home: House Home Access: Stairs to enter Entrance Stairs-Rails: Left Entrance Stairs-Number of Steps: 3 (side entrance-carport)   Home Layout: One level Home Equipment: Agricultural consultant (2 wheels);BSC/3in1;Cane - single point;Grab bars - tub/shower      Prior Function Prior Level of Function : Independent/Modified Independent             Mobility Comments: Mod I, cane outdoors       Extremity/Trunk Assessment   Upper Extremity Assessment Upper Extremity Assessment: Defer to OT evaluation    Lower Extremity Assessment Lower Extremity Assessment: Generalized weakness (Denies radicular symptoms)    Cervical / Trunk Assessment Cervical / Trunk Assessment: Kyphotic  Communication   Communication Communication: No apparent difficulties Cueing Techniques: Verbal cues;Gestural cues;Visual cues  Cognition Arousal: Alert Behavior During Therapy: WFL for tasks assessed/performed Overall Cognitive Status: Impaired/Different from baseline Area of Impairment: Safety/judgement, Problem solving, Following commands, Memory                     Memory: Decreased recall of precautions Following Commands: Follows one step commands  consistently Safety/Judgement: Decreased awareness of deficits, Decreased awareness of safety   Problem Solving: Decreased initiation, Difficulty sequencing, Requires verbal cues          General Comments General comments (skin integrity, edema, etc.): Reviewed precautions, brace use, brace adjusted, and log roll technique. Sons present, very supportive    Exercises General Exercises - Lower Extremity Ankle Circles/Pumps: AROM, Both, 10 reps, Supine Quad Sets: Strengthening, Both, 10 reps, Supine Gluteal Sets: Strengthening, Both, 10 reps, Supine   Assessment/Plan    PT Assessment Patient needs continued PT services  PT Problem List Decreased strength;Decreased range of motion;Decreased activity tolerance;Decreased balance;Decreased mobility;Decreased knowledge of use of DME;Decreased knowledge of precautions;Pain       PT Treatment Interventions DME instruction;Gait training;Stair training;Functional mobility training;Therapeutic activities;Therapeutic exercise;Balance training;Neuromuscular re-education;Patient/family education;Modalities    PT Goals (Current goals can be found in the Care Plan section)  Acute Rehab PT Goals Patient Stated Goal: Feel better PT Goal Formulation: With patient Time For Goal Achievement: 08/11/23 Potential to Achieve Goals: Good    Frequency Min 1X/week     Co-evaluation               AM-PAC PT "6 Clicks" Mobility  Outcome Measure Help needed turning from your back to your side while in a flat bed without using bedrails?: A Little Help needed moving from lying on your back to sitting on the side of a flat bed without using bedrails?: A Lot Help needed moving to and from a bed to a chair (including a wheelchair)?: A Lot Help needed standing up from a chair using your arms (e.g., wheelchair  or bedside chair)?: A Lot Help needed to walk in hospital room?: A Little Help needed climbing 3-5 steps with a railing? : Total 6 Click Score:  13    End of Session Equipment Utilized During Treatment: Gait belt;Back brace Activity Tolerance: Patient tolerated treatment well;Patient limited by pain Patient left: in bed;with call bell/phone within reach;with bed alarm set;with family/visitor present   PT Visit Diagnosis: Pain;Difficulty in walking, not elsewhere classified (R26.2);History of falling (Z91.81);Repeated falls (R29.6);Unsteadiness on feet (R26.81) Pain - part of body:  (back)    Time: 1308-6578 PT Time Calculation (min) (ACUTE ONLY): 16 min   Charges:   PT Evaluation $PT Eval Low Complexity: 1 Low   PT General Charges $$ ACUTE PT VISIT: 1 Visit         Kathlyn Sacramento, PT, DPT [facility]  Rehabilitation Services Physical Therapist Office: [phone] Website: [email or web address]   Berton Mount 07/28/2023, 12:48 PM

## 2023-07-28 NOTE — Hospital Course (Addendum)
Taken from H&P.  Gabriela Jefferson is a 89 y.o. female with medical history significant for hypertension, hypothyroidism, type 2 diabetes, GERD, CKD 3B, who initially presented to [facility] ED after a mechanical fall.  She fell over the weekend while trying to open a window, since then she was having back pain.  She fell again on the day of admission while standing from a chair, hit her head, no loss of consciousness.  On arrival to ED CT head with no acute intracranial abnormality.  CT lumbar spine revealed acute/subacute fracture involving L1 inferior endplate with extension to the posterior cortex and slight buckling.  Neurosurgery was consulted from ED, they recommend pain control, TLSO brace and outpatient follow-up.  10/9: Vital and labs stable stable.  A1c of 6.9.  Continue to have pain with ambulation, PT and OT are recommending SNF.  TOC was consulted  10/10: Patient remained stable.  Able to work with PT.  Pain seems well-controlled with current regimen.  Although nausea and vomiting was listed with OxyContin, she was able to take oxycodone without any difficulty.  Patient should be tried Tylenol first and if that does not help then take narcotic.  Please give her regular bowel regimen if taking narcotic to avoid constipation. Patient will continue with TLSO brace all the time.  She will follow-up with spinal surgery as outpatient for further recommendations.  Patient will continue on current medications and need to have a close follow-up with her providers for further management.

## 2023-07-28 NOTE — Assessment & Plan Note (Signed)
Continue statin. 

## 2023-07-28 NOTE — Assessment & Plan Note (Signed)
Patient was on metformin at home, seems well-controlled with A1c of 6.9. -Continue with SSI

## 2023-07-28 NOTE — H&P (Addendum)
History and Physical  Gabriela Jefferson AOZ:[id number] DOB: 07-03-1932 DOA: 07/27/2023  Referring physician: Accepted by Dr. Arlean Hopping, [facility], hospitalist service. PCP: Creola Corn, MD  Outpatient Specialists: ENT, orthopedic surgery. Patient coming from: Home.    Chief Complaint: Fall.  HPI: Gabriela Jefferson is a 89 y.o. female with medical history significant for hypertension, hypothyroidism, type 2 diabetes, GERD, CKD 3B, who initially presented to [facility] ED after a mechanical fall.  Denies feeling lightheaded or dizzy prior to her fall.  Had a fall over the weekend while trying to open a window.  Since then has had a back pain.  She fell again today while standing from a chair.  Hit her head.  No complete loss of consciousness reported.  EMS was activated.  In the ED, noncontrast head CT revealed no acute intracranial findings.  CT lumbar spine revealed acute or subacute fracture involving the L1 inferior endplate with extension to the posterior cortex and slight buckling.  No significant height loss.  EDP discussed the case with neurosurgery.  Recommended admission by hospitalist service for pain control, TLSO brace, and follow-up outpatient with neurosurgery.  ED Course: Temperature 98.9.  BP 136/84, pulse 81, respiration rate 18, O2 saturation 99% on room air.  Lab studies notable for creatinine 1.39 with GFR of 38.  Hemoglobin 11.2.  WBC 7.8, platelet count 207.  Review of Systems: Review of systems as noted in the HPI. All other systems reviewed and are negative.   Past Medical History:  Diagnosis Date   Adenomatous colon polyp    Arthritis    Diabetes mellitus    type 2   GERD (gastroesophageal reflux disease)    Hyperlipidemia    Hypertension    Hypothyroidism    Insomnia    Neuropathy    Osteoporosis    Plantar fasciitis    Vertigo    Vitamin B12 deficiency    Vitamin D deficiency    Wears dentures    upper   Wears glasses    Past Surgical History:  Procedure  Laterality Date   ABDOMINAL HYSTERECTOMY     COLONOSCOPY     CORRECTION HAMMER TOE     left foot   CYSTOCELE REPAIR     HAND SURGERY     MASS EXCISION Right 06/13/2013   Procedure: EXCISION MASS RIGHT LONG FINGER;  Surgeon: Wyn Forster., MD;  Location: [facility];  Service: Orthopedics;  Laterality: Right;   RECTOCELE REPAIR     SHOULDER ARTHROSCOPY  2008   left rotator cuff   STERIOD INJECTION  07/12/2012   Procedure: STEROID INJECTION;  Surgeon: Wyn Forster., MD;  Location: [facility];  Service: Orthopedics;  Laterality: Right;  right thumbcarpal-metacarpal joint   TOE SURGERY Left    TRIGGER FINGER RELEASE  2005   lt ring   TRIGGER FINGER RELEASE  07/12/2012   Procedure: RELEASE TRIGGER FINGER/A-1 PULLEY;  Surgeon: Wyn Forster., MD;  Location: [facility];  Service: Orthopedics;  Laterality: Right;  right long    TUBAL LIGATION      Social History:  reports that she has never smoked. She has never used smokeless tobacco. She reports that she does not drink alcohol and does not use drugs.   Allergies  Allergen Reactions   Oxycodone Nausea And Vomiting    Family History  Problem Relation Age of Onset   COPD Father    Dementia Mother    Diabetes Mellitus  II Mother    Diabetes Mellitus II Sister    Diabetes Mellitus II Sister    Colon cancer Neg Hx       Prior to Admission medications   Medication Sig Start Date End Date Taking? Authorizing Provider  acetaminophen (TYLENOL) 325 MG tablet Take 2 tablets (650 mg total) by mouth every 6 (six) hours. 01/28/18   Meuth, Brooke A, PA-C  bacitracin ointment Apply topically 2 (two) times daily. Patient not taking: Reported on 08/11/2019 01/28/18   Carlena Bjornstad A, PA-C  cholecalciferol (VITAMIN D) 1000 UNITS tablet Take 1,000 Units by mouth daily.     [provider]  levothyroxine (SYNTHROID) 50 MCG tablet Take 50 mcg by mouth daily before breakfast. Take  Tuesday, Thursday and Saturday.    [provider]  levothyroxine (SYNTHROID, LEVOTHROID) 100 MCG tablet Take 100 mcg by mouth daily before breakfast. Take 100 mcg on M, W, F, Sunday.    [provider]  losartan (COZAAR) 50 MG tablet Take 25 mg by mouth daily. Take a half tablet daily    [provider]  metFORMIN (GLUCOPHAGE) 500 MG tablet Take 1,000 mg by mouth daily.     [provider]  polyethylene glycol (MIRALAX / GLYCOLAX) packet Take 17 g by mouth daily. 01/28/18   Meuth, Brooke A, PA-C  rosuvastatin (CRESTOR) 10 MG tablet Take 10 mg by mouth daily.    [provider]  vitamin B-12 (CYANOCOBALAMIN) 100 MCG tablet Take 100 mcg by mouth daily.     [provider]    Physical Exam: BP (!) 152/62 (BP Location: Right Arm)   Pulse 80   Temp 98.9 F (37.2 C)   Resp 18   Ht 5\' 6"  (1.676 m)   Wt 84.5 kg   SpO2 99%   BMI 30.07 kg/m   General: 89 y.o. year-old female well developed well nourished in no acute distress.  Alert and oriented x3. Cardiovascular: Regular rate and rhythm with no rubs or gallops.  No thyromegaly or JVD noted.  No lower extremity edema. 2/4 pulses in all 4 extremities. Respiratory: Clear to auscultation with no wheezes or rales. Good inspiratory effort. Abdomen: Soft nontender nondistended with normal bowel sounds x4 quadrants. Muskuloskeletal: No cyanosis, clubbing or edema noted bilaterally Neuro: CN II-XII intact, strength, sensation, reflexes Skin: No ulcerative lesions noted or rashes Psychiatry: Judgement and insight appear normal. Mood is appropriate for condition and setting          Labs on Admission:  Basic Metabolic Panel: Recent Labs  Lab 07/27/23 2205  NA 137  K 4.3  CL 103  CO2 23  GLUCOSE 194*  BUN 23  CREATININE 1.35*  CALCIUM 8.9   Liver Function Tests: No results for input(s): "AST", "ALT", "ALKPHOS", "BILITOT", "PROT", "ALBUMIN" in the last 168 hours. No results for  input(s): "LIPASE", "AMYLASE" in the last 168 hours. No results for input(s): "AMMONIA" in the last 168 hours. CBC: Recent Labs  Lab 07/27/23 2205  WBC 7.8  NEUTROABS 5.9  HGB 11.2*  HCT 34.0*  MCV 89.2  PLT 207   Cardiac Enzymes: No results for input(s): "CKTOTAL", "CKMB", "CKMBINDEX", "TROPONINI" in the last 168 hours.  BNP (last 3 results) No results for input(s): "BNP" in the last 8760 hours.  ProBNP (last 3 results) No results for input(s): "PROBNP" in the last 8760 hours.  CBG: Recent Labs  Lab 07/27/23 1702  GLUCAP 114*    Radiological Exams on Admission: CT Head Wo Contrast  Result Date: 07/27/2023 CLINICAL DATA:  Head trauma, minor (Age >= 65y); Neck trauma (Age >= 65y); Back trauma, no prior imaging (Age >= 16y) EXAM: CT HEAD WITHOUT CONTRAST CT CERVICAL SPINE WITHOUT CONTRAST CT LUMBAR SPINE WITHOUT CONTRAST TECHNIQUE: Multidetector CT imaging of the head, cervical and lumbar spine was performed following the standard protocol without intravenous contrast. Multiplanar CT image reconstructions of the cervical and lumbar spine were also generated. RADIATION DOSE REDUCTION: This exam was performed according to the departmental dose-optimization program which includes automated exposure control, adjustment of the mA and/or kV according to patient size and/or use of iterative reconstruction technique. COMPARISON:  None Available. FINDINGS: CT HEAD FINDINGS Brain: No evidence of acute infarction, hemorrhage, hydrocephalus, extra-axial collection or mass lesion/mass effect. Patchy white matter hypodensities are nonspecific but compatible with chronic microvascular ischemic disease. Vascular: Calcific atherosclerosis. Skull: No acute fracture. Sinuses/Orbits: Clear sinuses.  No acute orbital findings. Other: No mastoid effusions. CT CERVICAL SPINE FINDINGS Alignment: No substantial sagittal subluxation.  Broad levocurvature Skull base and vertebrae: No acute fracture.  Osteopenia.  Soft tissues and spinal canal: No prevertebral fluid or swelling. No visible canal hematoma. Disc levels: Multilevel degenerative disc disease, greatest at C5-C6 where there is endplate spurring and posterior disc/osteophyte complex. Right foraminal stenosis at this level. Upper chest: Visualized lung apices are clear. CT LUMBAR SPINE FINDINGS Alignment: Grade 1 anterolisthesis of L4 on L5. Vertebral bodies: Linear lucency through the inferior L1 vertebral body with extension to the posterior cortex, compatible with a fracture that is probably acute or subacute. No significant height loss. Mild associated buckling of the posterior cortex without significant canal stenosis. Osteopenia. Disc levels: Severe degenerative change at L4-L5 on the left where there is disc height loss, vacuum disc phenomenon and endplate spurring. Paraspinal: Aorto bi-iliac calcific atherosclerosis. IMPRESSION: CT lumbar spine: 1. Probably acute or subacute fracture involving the L1 inferior endplate with extension to the posterior cortex and slight buckling. No significant height loss. 2. Severe degenerative change at L4-L5 on the left. CT head: No evidence of acute intracranial abnormality. CT cervical spine: 1. No evidence of acute fracture or traumatic malalignment in the cervical spine. 2. Degenerative changes at C5-C6 where there is right foraminal stenosis. Findings discussed with Dr. Theresia Lo via telephone at 6:56 p.m. Electronically Signed   By: Feliberto Harts M.D.   On: 07/27/2023 18:58   CT Cervical Spine Wo Contrast  Result Date: 07/27/2023 CLINICAL DATA:  Head trauma, minor (Age >= 65y); Neck trauma (Age >= 65y); Back trauma, no prior imaging (Age >= 16y) EXAM: CT HEAD WITHOUT CONTRAST CT CERVICAL SPINE WITHOUT CONTRAST CT LUMBAR SPINE WITHOUT CONTRAST TECHNIQUE: Multidetector CT imaging of the head, cervical and lumbar spine was performed following the standard protocol without intravenous contrast. Multiplanar CT image  reconstructions of the cervical and lumbar spine were also generated. RADIATION DOSE REDUCTION: This exam was performed according to the departmental dose-optimization program which includes automated exposure control, adjustment of the mA and/or kV according to patient size and/or use of iterative reconstruction technique. COMPARISON:  None Available. FINDINGS: CT HEAD FINDINGS Brain: No evidence of acute infarction, hemorrhage, hydrocephalus, extra-axial collection or mass lesion/mass effect. Patchy white matter hypodensities are nonspecific but compatible with chronic microvascular ischemic disease. Vascular: Calcific atherosclerosis. Skull: No acute fracture. Sinuses/Orbits: Clear sinuses.  No acute orbital findings. Other: No mastoid effusions. CT CERVICAL SPINE FINDINGS Alignment: No substantial sagittal subluxation.  Broad levocurvature Skull base and vertebrae: No acute fracture.  Osteopenia. Soft tissues and spinal  canal: No prevertebral fluid or swelling. No visible canal hematoma. Disc levels: Multilevel degenerative disc disease, greatest at C5-C6 where there is endplate spurring and posterior disc/osteophyte complex. Right foraminal stenosis at this level. Upper chest: Visualized lung apices are clear. CT LUMBAR SPINE FINDINGS Alignment: Grade 1 anterolisthesis of L4 on L5. Vertebral bodies: Linear lucency through the inferior L1 vertebral body with extension to the posterior cortex, compatible with a fracture that is probably acute or subacute. No significant height loss. Mild associated buckling of the posterior cortex without significant canal stenosis. Osteopenia. Disc levels: Severe degenerative change at L4-L5 on the left where there is disc height loss, vacuum disc phenomenon and endplate spurring. Paraspinal: Aorto bi-iliac calcific atherosclerosis. IMPRESSION: CT lumbar spine: 1. Probably acute or subacute fracture involving the L1 inferior endplate with extension to the posterior cortex and  slight buckling. No significant height loss. 2. Severe degenerative change at L4-L5 on the left. CT head: No evidence of acute intracranial abnormality. CT cervical spine: 1. No evidence of acute fracture or traumatic malalignment in the cervical spine. 2. Degenerative changes at C5-C6 where there is right foraminal stenosis. Findings discussed with Dr. Theresia Lo via telephone at 6:56 p.m. Electronically Signed   By: Feliberto Harts M.D.   On: 07/27/2023 18:58   CT Lumbar Spine Wo Contrast  Result Date: 07/27/2023 CLINICAL DATA:  Head trauma, minor (Age >= 65y); Neck trauma (Age >= 65y); Back trauma, no prior imaging (Age >= 16y) EXAM: CT HEAD WITHOUT CONTRAST CT CERVICAL SPINE WITHOUT CONTRAST CT LUMBAR SPINE WITHOUT CONTRAST TECHNIQUE: Multidetector CT imaging of the head, cervical and lumbar spine was performed following the standard protocol without intravenous contrast. Multiplanar CT image reconstructions of the cervical and lumbar spine were also generated. RADIATION DOSE REDUCTION: This exam was performed according to the departmental dose-optimization program which includes automated exposure control, adjustment of the mA and/or kV according to patient size and/or use of iterative reconstruction technique. COMPARISON:  None Available. FINDINGS: CT HEAD FINDINGS Brain: No evidence of acute infarction, hemorrhage, hydrocephalus, extra-axial collection or mass lesion/mass effect. Patchy white matter hypodensities are nonspecific but compatible with chronic microvascular ischemic disease. Vascular: Calcific atherosclerosis. Skull: No acute fracture. Sinuses/Orbits: Clear sinuses.  No acute orbital findings. Other: No mastoid effusions. CT CERVICAL SPINE FINDINGS Alignment: No substantial sagittal subluxation.  Broad levocurvature Skull base and vertebrae: No acute fracture.  Osteopenia. Soft tissues and spinal canal: No prevertebral fluid or swelling. No visible canal hematoma. Disc levels: Multilevel  degenerative disc disease, greatest at C5-C6 where there is endplate spurring and posterior disc/osteophyte complex. Right foraminal stenosis at this level. Upper chest: Visualized lung apices are clear. CT LUMBAR SPINE FINDINGS Alignment: Grade 1 anterolisthesis of L4 on L5. Vertebral bodies: Linear lucency through the inferior L1 vertebral body with extension to the posterior cortex, compatible with a fracture that is probably acute or subacute. No significant height loss. Mild associated buckling of the posterior cortex without significant canal stenosis. Osteopenia. Disc levels: Severe degenerative change at L4-L5 on the left where there is disc height loss, vacuum disc phenomenon and endplate spurring. Paraspinal: Aorto bi-iliac calcific atherosclerosis. IMPRESSION: CT lumbar spine: 1. Probably acute or subacute fracture involving the L1 inferior endplate with extension to the posterior cortex and slight buckling. No significant height loss. 2. Severe degenerative change at L4-L5 on the left. CT head: No evidence of acute intracranial abnormality. CT cervical spine: 1. No evidence of acute fracture or traumatic malalignment in the cervical spine. 2. Degenerative changes  at C5-C6 where there is right foraminal stenosis. Findings discussed with Dr. Theresia Lo via telephone at 6:56 p.m. Electronically Signed   By: Feliberto Harts M.D.   On: 07/27/2023 18:58    EKG: I independently viewed the EKG done and my findings are as followed: None available at the time of this visit.  Assessment/Plan Present on Admission:  Closed L1 vertebral fracture (HCC)  Principal Problem:   Closed L1 vertebral fracture (HCC)  Closed L1 vertebral fracture post mechanical fall Admitted for pain control Will need to follow-up with neurosurgery outpatient. PT OT assessment Fall precautions  Type 2 diabetes with hyperglycemia Hold off home hypoglycemics Last hemoglobin A1c 7.5 on 01/27/2018 Obtain hemoglobin A1c Start  insulin sliding scale.  Hypertension Resume home losartan  CKD 3B Appears to be at baseline Monitor urine output Encourage oral hydration Avoid nephrotoxic agents, dehydration, and hypotension.  Hypothyroidism Resume home levothyroxine  Hyperlipidemia Resume home Crestor  Vitamin B12 deficiency Resume home regimen   Time: 75 minutes.   DVT prophylaxis: Subcu heparin 3 times daily.  Code Status: Full code  Family Communication: None at bedside  Disposition Plan: Admitted to MedSurg unit  Consults called: Neurosurgery  Admission status: Observation status   Status is: Observation    Darlin Drop MD [facility] Pager [phone]  If 7PM-7AM, please contact night-coverage [email or web address] Password [email or web address]  07/28/2023, 2:24 AM

## 2023-07-28 NOTE — Care Management Obs Status (Signed)
MEDICARE OBSERVATION STATUS NOTIFICATION   Patient Details  Name: Gabriela Jefferson MRN: [id number] Date of Birth: 13-Feb-1932   Medicare Observation Status Notification Given:  Yes    Baldemar Lenis, LCSW 07/28/2023, 2:40 PM

## 2023-07-28 NOTE — TOC Initial Note (Signed)
Transition of Care [facility]) - Initial/Assessment Note    Patient Details  Name: Gabriela Jefferson MRN: [id number] Date of Birth: 08-15-1932  [facility] CM/SW Contact:    Lizzett Nobile Felipa [place], Student-Social Work Phone Number: 07/28/2023, 2:45 PM  Clinical Narrative:     MSW Student and CSW met with patient and discussed potential placement at SNF. Patient was in agreement and wanted to be transported to [facility] care for rehab. MSW student and CSW reached out to [facility] health care and they can offer a bed for patient. MSW student to request authorization for insurance approval.    Expected Discharge Plan: Skilled Nursing Facility Barriers to Discharge: Continued Medical Work up, English as a second language teacher   Patient Goals and CMS Choice Patient states their goals for this hospitalization and ongoing recovery are:: To Get Rehab CMS Medicare.gov Compare Post Acute Care list provided to:: Patient Choice offered to / list presented to : Patient [place] ownership interest in [facility].provided to:: Patient    Expected Discharge Plan and Services     Post Acute Care Choice: Skilled Nursing Facility Living arrangements for the past 2 months: Single Family Home                                      Prior Living Arrangements/Services Living arrangements for the past 2 months: Single Family Home Lives with:: Adult Children Patient language and need for interpreter reviewed:: No Do you feel safe going back to the place where you live?: Yes      Need for Family Participation in Patient Care: No (Comment) Care giver support system in place?: Yes (comment) Current home services: DME Criminal Activity/Legal Involvement Pertinent to Current Situation/Hospitalization: No - Comment as needed  Activities of Daily Living   ADL Screening (condition at time of admission) Independently performs ADLs?: No Does the patient have a NEW difficulty with  bathing/dressing/toileting/self-feeding that is expected to last >3 days?: Yes (Initiates electronic notice to provider for possible OT consult) Does the patient have a NEW difficulty with getting in/out of bed, walking, or climbing stairs that is expected to last >3 days?: Yes (Initiates electronic notice to provider for possible PT consult) Does the patient have a NEW difficulty with communication that is expected to last >3 days?: Yes (Initiates electronic notice to provider for possible SLP consult) Is the patient deaf or have difficulty hearing?: Yes Does the patient have difficulty seeing, even when wearing glasses/contacts?: Yes Does the patient have difficulty concentrating, remembering, or making decisions?: Yes  Permission Sought/Granted Permission sought to share information with : Facility Medical sales representative, Family Supports Permission granted to share information with : Yes, Verbal Permission Granted  Share Information with NAME: Pam and Knorris  Permission granted to share info w AGENCY: SNF  Permission granted to share info w Relationship: Children     Emotional Assessment Appearance:: Appears stated age Attitude/Demeanor/Rapport: Engaged Affect (typically observed): Appropriate Orientation: : Oriented to Self, Oriented to Place, Oriented to  Time, Oriented to Situation Alcohol / Substance Use: Not Applicable Psych Involvement: No (comment)  Admission diagnosis:  Closed L1 vertebral fracture (HCC) [S32.019A] Other closed fracture of first lumbar vertebra, initial encounter (HCC) [S32.018A] Patient Active Problem List   Diagnosis Date Noted   Chronic kidney disease, stage 3b (HCC) 07/28/2023   Hypothyroidism 07/28/2023   Essential hypertension 07/28/2023   Hyperlipidemia 07/28/2023   Closed L1 vertebral fracture (  HCC) 07/27/2023   SDH (subdural hematoma) (HCC) 01/24/2018   Dysphagia 05/07/2016   Reflux 05/07/2016   Constipation 05/07/2016   Chest pain 05/06/2016    RLQ PAIN 08/08/2009   CONSTIPATION 02/04/2009   Type 2 diabetes mellitus with chronic kidney disease, without long-term current use of insulin (HCC) 12/26/2008   GASTROPARESIS 12/19/2008   DYSPHAGIA 12/19/2008   PCP:  Creola Corn, MD Pharmacy:   [facility] [phone] - [place], [place] - [address] AT [facility] [address] [facility] [address] [place] [address] Phone: [phone] Fax: [phone]     Social Determinants of Health (SDOH) Social History: SDOH Screenings   Food Insecurity: No Food Insecurity (07/28/2023)  Housing: Low Risk  (07/28/2023)  Transportation Needs: No Transportation Needs (07/28/2023)  Utilities: Not At Risk (07/28/2023)  Depression (PHQ2-9): Low Risk  (04/25/2020)  Social Connections: Unknown (08/11/2019)  Tobacco Use: Low Risk  (07/27/2023)   SDOH Interventions:     Readmission Risk Interventions     No data to display

## 2023-07-28 NOTE — Assessment & Plan Note (Signed)
Blood pressure currently within goal. -Restarting home hydrochlorothiazide -Holding losartan which can be added as needed

## 2023-07-28 NOTE — Assessment & Plan Note (Signed)
Continue Synthroid °

## 2023-07-28 NOTE — Evaluation (Signed)
Occupational Therapy Evaluation Patient Details Name: Gabriela Jefferson MRN: [id number] DOB: 1932-06-26 Today's Date: 07/28/2023   History of Present Illness 89 y.o. female with medical history significant for hypertension, hypothyroidism, type 2 diabetes, GERD, CKD 3B, who initially presented to [facility] ED after a mechanical fall. CT lumbar spine revealed acute or subacute fracture involving the L1 inferior endplate with extension to the posterior cortex and slight buckling.   Clinical Impression   Pt admitted with the above diagnosis. Pt currently with functional limitations due to the deficits listed below (see OT Problem List). Prior to admit, pt was living at home with her daughter and was independent-Mod I for all BADL tasks and functional mobility.  Pt will benefit from acute skilled OT to increase their safety and independence with ADL and functional mobility for ADL to facilitate discharge. Provided back precautions handout at end of session. Will require additional review at next therapy session. Patient will benefit from continued inpatient follow up therapy, <3 hours/day. OT will continue to follow patient acutely.           If plan is discharge home, recommend the following: A lot of help with walking and/or transfers;A lot of help with bathing/dressing/bathroom;Assist for transportation;Assistance with cooking/housework;Help with stairs or ramp for entrance    Functional Status Assessment  Patient has had a recent decline in their functional status and demonstrates the ability to make significant improvements in function in a reasonable and predictable amount of time.  Equipment Recommendations  Other (comment) (defer to next venue of care)    Recommendations for Other Services PT consult     Precautions / Restrictions Precautions Precautions: Back;Fall Precaution Booklet Issued: Yes (comment) Precaution Comments: Provided verbal education on back precautions. Pt verbalized  understanding Required Braces or Orthoses: Spinal Brace Spinal Brace: Thoracolumbosacral orthotic;Applied in sitting position Restrictions Weight Bearing Restrictions: No      Mobility Bed Mobility Overal bed mobility: Needs Assistance Bed Mobility: Rolling, Sidelying to Sit Rolling: Min assist, Used rails Sidelying to sit: Max assist, HOB elevated, Used rails       General bed mobility comments: VC for back precautions and log roll technique with tactile cues used also.    Transfers Overall transfer level: Needs assistance Equipment used: Rolling walker (2 wheels) Transfers: Sit to/from Stand, Bed to chair/wheelchair/BSC Sit to Stand: Mod assist, From elevated surface     Step pivot transfers: Min assist     General transfer comment: VC for hand placement with RW management prior to sit to stand transition. VC to slightly lean forward, push BLE into ground, squeeze glutes, and push into RW with BLE. Consistant and frequent verbal and tactile cues provided to correct stand posture.      Balance Overall balance assessment: Needs assistance, History of Falls Sitting-balance support: Bilateral upper extremity supported, Feet supported Sitting balance-Leahy Scale: Fair Sitting balance - Comments: sitting EOB. Initially demonstrated posterior lean while seated EOB. With assist for positioning and VC pt was able to correct and maintain static sitting balance without physical assist.   Standing balance support: Bilateral upper extremity supported, During functional activity, Reliant on assistive device for balance Standing balance-Leahy Scale: Poor              ADL either performed or assessed with clinical judgement   ADL Overall ADL's : Needs assistance/impaired     Grooming: Wash/dry hands;Oral care;Sitting;Set up   Upper Body Bathing: Minimal assistance;Sitting   Lower Body Bathing: Maximal assistance;Sit to/from stand   Upper  Body Dressing : Maximal  assistance;Sitting   Lower Body Dressing: Total assistance;Sit to/from stand   Toilet Transfer: Minimal assistance;Cueing for safety;Cueing for sequencing;Ambulation;BSC/3in1;Grab bars Toilet Transfer Details (indicate cue type and reason): BSC over toilet Toileting- Clothing Manipulation and Hygiene: Moderate assistance;Sit to/from stand;Cueing for back precautions       Functional mobility during ADLs: Minimal assistance;Cueing for sequencing;Rolling walker (2 wheels)       Vision Baseline Vision/History: 1 Wears glasses Ability to See in Adequate Light: 0 Adequate Patient Visual Report: No change from baseline Vision Assessment?: Wears glasses for reading     Perception Perception: Not tested       Praxis Praxis: Not tested       Pertinent Vitals/Pain Pain Assessment Pain Assessment: 0-10 Pain Score: 9  Pain Location: back with mobility/movement Pain Descriptors / Indicators: Aching, Grimacing, Discomfort Pain Intervention(s): Limited activity within patient's tolerance, RN gave pain meds during session, Monitored during session, Repositioned     Extremity/Trunk Assessment Upper Extremity Assessment Upper Extremity Assessment: Generalized weakness   Lower Extremity Assessment Lower Extremity Assessment: Generalized weakness   Cervical / Trunk Assessment Cervical / Trunk Assessment: Kyphotic   Communication Communication Communication: No apparent difficulties Cueing Techniques: Verbal cues;Tactile cues   Cognition Arousal: Alert Behavior During Therapy: WFL for tasks assessed/performed Overall Cognitive Status: Impaired/Different from baseline Area of Impairment: Safety/judgement, Problem solving, Following commands, Memory    Memory: Decreased recall of precautions Following Commands: Follows one step commands consistently Safety/Judgement: Decreased awareness of deficits, Decreased awareness of safety   Problem Solving: Decreased initiation, Difficulty  sequencing, Requires verbal cues, Requires tactile cues       General Comments  VSS on RA            Home Living Family/patient expects to be discharged to:: Private residence Living Arrangements: Children (adult daughter (works and is gone during the day)) Available Help at Discharge: Family;Available PRN/intermittently;Available 24 hours/day (adult son is retired and states that he can assist as needed) Type of Home: House Home Access: Stairs to enter Secretary/administrator of Steps: 3 (side entrance-carport) Entrance Stairs-Rails: Left Home Layout: One level     Bathroom Shower/Tub: Chief Strategy Officer: Handicapped height     Home Equipment: Agricultural consultant (2 wheels);BSC/3in1;Cane - single point;Grab bars - tub/shower          Prior Functioning/Environment Prior Level of Function : Independent/Modified Independent           OT Problem List: Decreased strength;Pain;Decreased cognition;Decreased activity tolerance;Decreased safety awareness;Decreased knowledge of use of DME or AE;Impaired balance (sitting and/or standing);Decreased knowledge of precautions      OT Treatment/Interventions: Self-care/ADL training;Therapeutic activities;Therapeutic exercise;Cognitive remediation/compensation;Energy conservation;Patient/family education;DME and/or AE instruction;Manual therapy;Balance training    OT Goals(Current goals can be found in the care plan section) Acute Rehab OT Goals Patient Stated Goal: to use the bathroom OT Goal Formulation: Patient unable to participate in goal setting Time For Goal Achievement: 08/11/23 Potential to Achieve Goals: Good  OT Frequency: Min 1X/week       AM-PAC OT "6 Clicks" Daily Activity     Outcome Measure Help from another person eating meals?: A Little Help from another person taking care of personal grooming?: A Little Help from another person toileting, which includes using toliet, bedpan, or urinal?: A Lot Help  from another person bathing (including washing, rinsing, drying)?: A Lot Help from another person to put on and taking off regular upper body clothing?: A Lot Help from another person to  put on and taking off regular lower body clothing?: Total 6 Click Score: 13   End of Session Equipment Utilized During Treatment: Gait belt;Rolling walker (2 wheels);Back brace Nurse Communication: Mobility status;Patient requests pain meds  Activity Tolerance: Patient tolerated treatment well;Patient limited by pain Patient left: in chair;with call bell/phone within reach;with chair alarm set;with family/visitor present  OT Visit Diagnosis: Unsteadiness on feet (R26.81);Muscle weakness (generalized) (M62.81);History of falling (Z91.81);Pain Pain - part of body:  (back)                Time: 1610-9604 OT Time Calculation (min): 56 min Charges:  OT General Charges $OT Visit: 1 Visit OT Evaluation $OT Eval High Complexity: 1 High OT Treatments $Self Care/Home Management : 23-37 mins  Limmie Patricia, OTR/L,CBIS  Supplemental OT - [facility] and [facility] Secure Chat Preferred    Jodi Kappes, Charisse March 07/28/2023, 12:04 PM

## 2023-07-28 NOTE — ED Notes (Signed)
ED TO INPATIENT HANDOFF REPORT  ED Nurse Name and Phone #: Casey Burkitt [phone]  S Name/Age/Gender Franklyn Lor 89 y.o. female Room/Bed: DB012/DB012  Code Status   Code Status: Prior  Home/SNF/Other Home Patient oriented to: self, place, time, and situation Is this baseline? Yes   Triage Complete: Triage complete  Chief Complaint Closed L1 vertebral fracture (HCC) [S32.019A]  Triage Note Per [facility] pt fell standing from chair, hit head on right parietal region on table, small bump noted  Not on blood thinners States injured back on Saturday and was put on muscle relaxer \ States continued and worsening lower back pain     Allergies Allergies  Allergen Reactions   Oxycodone Nausea And Vomiting    Level of Care/Admitting Diagnosis ED Disposition     ED Disposition  Admit   Condition  --   Comment  Hospital Area: [facility] [[id number]]  Level of Care: Med-Surg [16]  Interfacility transfer: Yes  May place patient in observation at [facility] or [facility] if equivalent level of care is available:: Yes  Covid Evaluation: Asymptomatic - no recent exposure (last 10 days) testing not required  Diagnosis: Closed L1 vertebral fracture [facility]) [[id number]]  Admitting Physician: Angie Fava [[id number]]  Attending Physician: Angie Fava [[id number]]          B Medical/Surgery History Past Medical History:  Diagnosis Date   Adenomatous colon polyp    Arthritis    Diabetes mellitus    type 2   GERD (gastroesophageal reflux disease)    Hyperlipidemia    Hypertension    Hypothyroidism    Insomnia    Neuropathy    Osteoporosis    Plantar fasciitis    Vertigo    Vitamin B12 deficiency    Vitamin D deficiency    Wears dentures    upper   Wears glasses    Past Surgical History:  Procedure Laterality Date   ABDOMINAL HYSTERECTOMY     COLONOSCOPY     CORRECTION HAMMER TOE     left foot   CYSTOCELE REPAIR     HAND SURGERY      MASS EXCISION Right 06/13/2013   Procedure: EXCISION MASS RIGHT LONG FINGER;  Surgeon: Wyn Forster., MD;  Location: [facility];  Service: Orthopedics;  Laterality: Right;   RECTOCELE REPAIR     SHOULDER ARTHROSCOPY  2008   left rotator cuff   STERIOD INJECTION  07/12/2012   Procedure: STEROID INJECTION;  Surgeon: Wyn Forster., MD;  Location: [facility];  Service: Orthopedics;  Laterality: Right;  right thumbcarpal-metacarpal joint   TOE SURGERY Left    TRIGGER FINGER RELEASE  2005   lt ring   TRIGGER FINGER RELEASE  07/12/2012   Procedure: RELEASE TRIGGER FINGER/A-1 PULLEY;  Surgeon: Wyn Forster., MD;  Location: [facility];  Service: Orthopedics;  Laterality: Right;  right long    TUBAL LIGATION       A IV Location/Drains/Wounds Patient Lines/Drains/Airways Status     Active Line/Drains/Airways     Name Placement date Placement time Site Days   Peripheral IV 07/27/23 20 G Anterior;Proximal;Right Forearm 07/27/23  2206  Forearm  1   Wound / Incision (Open or Dehisced) 01/27/18 Other (Comment) Breast Left;Lower redden area 01/27/18  1010  Breast  2008   Wound / Incision (Open or Dehisced) 01/27/18 Other (Comment) Breast Left;Lower reddeb area 01/27/18  1010  Breast  2008            Intake/Output Last 24 hours No intake or output data in the 24 hours ending 07/28/23 0040  Labs/Imaging Results for orders placed or performed during the hospital encounter of 07/27/23 (from the past 48 hour(s))  CBG monitoring, ED     Status: Abnormal   Collection Time: 07/27/23  5:02 PM  Result Value Ref Range   Glucose-Capillary 114 (H) 70 - 99 mg/dL    Comment: Glucose reference range applies only to samples taken after fasting for at least 8 hours.  Basic metabolic panel     Status: Abnormal   Collection Time: 07/27/23 10:05 PM  Result Value Ref Range   Sodium 137 135 - 145 mmol/L   Potassium 4.3 3.5 - 5.1 mmol/L   Chloride  103 98 - 111 mmol/L   CO2 23 22 - 32 mmol/L   Glucose, Bld 194 (H) 70 - 99 mg/dL    Comment: Glucose reference range applies only to samples taken after fasting for at least 8 hours.   BUN 23 8 - 23 mg/dL   Creatinine, Ser 1.61 (H) 0.44 - 1.00 mg/dL   Calcium 8.9 8.9 - 09.6 mg/dL   GFR, Estimated 38 (L) >60 mL/min    Comment: (NOTE) Calculated using the CKD-EPI Creatinine Equation (2021)    Anion gap 11 5 - 15    Comment: Performed at [facility], [address], [place], [place] [address]  CBC with Differential     Status: Abnormal   Collection Time: 07/27/23 10:05 PM  Result Value Ref Range   WBC 7.8 4.0 - 10.5 K/uL   RBC 3.81 (L) 3.87 - 5.11 MIL/uL   Hemoglobin 11.2 (L) 12.0 - 15.0 g/dL   HCT 98.1 (L) 19.1 - 47.8 %   MCV 89.2 80.0 - 100.0 fL   MCH 29.4 26.0 - 34.0 pg   MCHC 32.9 30.0 - 36.0 g/dL   RDW 29.5 62.1 - 30.8 %   Platelets 207 150 - 400 K/uL   nRBC 0.0 0.0 - 0.2 %   Neutrophils Relative % 76 %   Neutro Abs 5.9 1.7 - 7.7 K/uL   Lymphocytes Relative 15 %   Lymphs Abs 1.2 0.7 - 4.0 K/uL   Monocytes Relative 8 %   Monocytes Absolute 0.6 0.1 - 1.0 K/uL   Eosinophils Relative 0 %   Eosinophils Absolute 0.0 0.0 - 0.5 K/uL   Basophils Relative 1 %   Basophils Absolute 0.0 0.0 - 0.1 K/uL   Immature Granulocytes 0 %   Abs Immature Granulocytes 0.03 0.00 - 0.07 K/uL    Comment: Performed at [facility], [address], [place], [place] [address]   CT Head Wo Contrast  Result Date: 07/27/2023 CLINICAL DATA:  Head trauma, minor (Age >= 65y); Neck trauma (Age >= 65y); Back trauma, no prior imaging (Age >= 16y) EXAM: CT HEAD WITHOUT CONTRAST CT CERVICAL SPINE WITHOUT CONTRAST CT LUMBAR SPINE WITHOUT CONTRAST TECHNIQUE: Multidetector CT imaging of the head, cervical and lumbar spine was performed following the standard protocol without intravenous contrast. Multiplanar CT image reconstructions of the cervical and lumbar spine were also  generated. RADIATION DOSE REDUCTION: This exam was performed according to the departmental dose-optimization program which includes automated exposure control, adjustment of the mA and/or kV according to patient size and/or use of iterative reconstruction technique. COMPARISON:  None Available. FINDINGS: CT HEAD FINDINGS Brain: No evidence of acute infarction, hemorrhage, hydrocephalus, extra-axial collection or  mass lesion/mass effect. Patchy white matter hypodensities are nonspecific but compatible with chronic microvascular ischemic disease. Vascular: Calcific atherosclerosis. Skull: No acute fracture. Sinuses/Orbits: Clear sinuses.  No acute orbital findings. Other: No mastoid effusions. CT CERVICAL SPINE FINDINGS Alignment: No substantial sagittal subluxation.  Broad levocurvature Skull base and vertebrae: No acute fracture.  Osteopenia. Soft tissues and spinal canal: No prevertebral fluid or swelling. No visible canal hematoma. Disc levels: Multilevel degenerative disc disease, greatest at C5-C6 where there is endplate spurring and posterior disc/osteophyte complex. Right foraminal stenosis at this level. Upper chest: Visualized lung apices are clear. CT LUMBAR SPINE FINDINGS Alignment: Grade 1 anterolisthesis of L4 on L5. Vertebral bodies: Linear lucency through the inferior L1 vertebral body with extension to the posterior cortex, compatible with a fracture that is probably acute or subacute. No significant height loss. Mild associated buckling of the posterior cortex without significant canal stenosis. Osteopenia. Disc levels: Severe degenerative change at L4-L5 on the left where there is disc height loss, vacuum disc phenomenon and endplate spurring. Paraspinal: Aorto bi-iliac calcific atherosclerosis. IMPRESSION: CT lumbar spine: 1. Probably acute or subacute fracture involving the L1 inferior endplate with extension to the posterior cortex and slight buckling. No significant height loss. 2. Severe  degenerative change at L4-L5 on the left. CT head: No evidence of acute intracranial abnormality. CT cervical spine: 1. No evidence of acute fracture or traumatic malalignment in the cervical spine. 2. Degenerative changes at C5-C6 where there is right foraminal stenosis. Findings discussed with Dr. Theresia Lo via telephone at 6:56 p.m. Electronically Signed   By: Feliberto Harts M.D.   On: 07/27/2023 18:58   CT Cervical Spine Wo Contrast  Result Date: 07/27/2023 CLINICAL DATA:  Head trauma, minor (Age >= 65y); Neck trauma (Age >= 65y); Back trauma, no prior imaging (Age >= 16y) EXAM: CT HEAD WITHOUT CONTRAST CT CERVICAL SPINE WITHOUT CONTRAST CT LUMBAR SPINE WITHOUT CONTRAST TECHNIQUE: Multidetector CT imaging of the head, cervical and lumbar spine was performed following the standard protocol without intravenous contrast. Multiplanar CT image reconstructions of the cervical and lumbar spine were also generated. RADIATION DOSE REDUCTION: This exam was performed according to the departmental dose-optimization program which includes automated exposure control, adjustment of the mA and/or kV according to patient size and/or use of iterative reconstruction technique. COMPARISON:  None Available. FINDINGS: CT HEAD FINDINGS Brain: No evidence of acute infarction, hemorrhage, hydrocephalus, extra-axial collection or mass lesion/mass effect. Patchy white matter hypodensities are nonspecific but compatible with chronic microvascular ischemic disease. Vascular: Calcific atherosclerosis. Skull: No acute fracture. Sinuses/Orbits: Clear sinuses.  No acute orbital findings. Other: No mastoid effusions. CT CERVICAL SPINE FINDINGS Alignment: No substantial sagittal subluxation.  Broad levocurvature Skull base and vertebrae: No acute fracture.  Osteopenia. Soft tissues and spinal canal: No prevertebral fluid or swelling. No visible canal hematoma. Disc levels: Multilevel degenerative disc disease, greatest at C5-C6 where there  is endplate spurring and posterior disc/osteophyte complex. Right foraminal stenosis at this level. Upper chest: Visualized lung apices are clear. CT LUMBAR SPINE FINDINGS Alignment: Grade 1 anterolisthesis of L4 on L5. Vertebral bodies: Linear lucency through the inferior L1 vertebral body with extension to the posterior cortex, compatible with a fracture that is probably acute or subacute. No significant height loss. Mild associated buckling of the posterior cortex without significant canal stenosis. Osteopenia. Disc levels: Severe degenerative change at L4-L5 on the left where there is disc height loss, vacuum disc phenomenon and endplate spurring. Paraspinal: Aorto bi-iliac calcific atherosclerosis. IMPRESSION: CT lumbar spine:  1. Probably acute or subacute fracture involving the L1 inferior endplate with extension to the posterior cortex and slight buckling. No significant height loss. 2. Severe degenerative change at L4-L5 on the left. CT head: No evidence of acute intracranial abnormality. CT cervical spine: 1. No evidence of acute fracture or traumatic malalignment in the cervical spine. 2. Degenerative changes at C5-C6 where there is right foraminal stenosis. Findings discussed with Dr. Theresia Lo via telephone at 6:56 p.m. Electronically Signed   By: Feliberto Harts M.D.   On: 07/27/2023 18:58   CT Lumbar Spine Wo Contrast  Result Date: 07/27/2023 CLINICAL DATA:  Head trauma, minor (Age >= 65y); Neck trauma (Age >= 65y); Back trauma, no prior imaging (Age >= 16y) EXAM: CT HEAD WITHOUT CONTRAST CT CERVICAL SPINE WITHOUT CONTRAST CT LUMBAR SPINE WITHOUT CONTRAST TECHNIQUE: Multidetector CT imaging of the head, cervical and lumbar spine was performed following the standard protocol without intravenous contrast. Multiplanar CT image reconstructions of the cervical and lumbar spine were also generated. RADIATION DOSE REDUCTION: This exam was performed according to the departmental dose-optimization program  which includes automated exposure control, adjustment of the mA and/or kV according to patient size and/or use of iterative reconstruction technique. COMPARISON:  None Available. FINDINGS: CT HEAD FINDINGS Brain: No evidence of acute infarction, hemorrhage, hydrocephalus, extra-axial collection or mass lesion/mass effect. Patchy white matter hypodensities are nonspecific but compatible with chronic microvascular ischemic disease. Vascular: Calcific atherosclerosis. Skull: No acute fracture. Sinuses/Orbits: Clear sinuses.  No acute orbital findings. Other: No mastoid effusions. CT CERVICAL SPINE FINDINGS Alignment: No substantial sagittal subluxation.  Broad levocurvature Skull base and vertebrae: No acute fracture.  Osteopenia. Soft tissues and spinal canal: No prevertebral fluid or swelling. No visible canal hematoma. Disc levels: Multilevel degenerative disc disease, greatest at C5-C6 where there is endplate spurring and posterior disc/osteophyte complex. Right foraminal stenosis at this level. Upper chest: Visualized lung apices are clear. CT LUMBAR SPINE FINDINGS Alignment: Grade 1 anterolisthesis of L4 on L5. Vertebral bodies: Linear lucency through the inferior L1 vertebral body with extension to the posterior cortex, compatible with a fracture that is probably acute or subacute. No significant height loss. Mild associated buckling of the posterior cortex without significant canal stenosis. Osteopenia. Disc levels: Severe degenerative change at L4-L5 on the left where there is disc height loss, vacuum disc phenomenon and endplate spurring. Paraspinal: Aorto bi-iliac calcific atherosclerosis. IMPRESSION: CT lumbar spine: 1. Probably acute or subacute fracture involving the L1 inferior endplate with extension to the posterior cortex and slight buckling. No significant height loss. 2. Severe degenerative change at L4-L5 on the left. CT head: No evidence of acute intracranial abnormality. CT cervical spine: 1. No  evidence of acute fracture or traumatic malalignment in the cervical spine. 2. Degenerative changes at C5-C6 where there is right foraminal stenosis. Findings discussed with Dr. Theresia Lo via telephone at 6:56 p.m. Electronically Signed   By: Feliberto Harts M.D.   On: 07/27/2023 18:58    Pending Labs Unresulted Labs (From admission, onward)    None       Vitals/Pain Today's Vitals   07/27/23 2131 07/27/23 2300 07/27/23 2335 07/27/23 2345  BP:  136/84  (!) 151/61  Pulse:  81  83  Resp:    16  Temp: 98.9 F (37.2 C)     TempSrc:      SpO2:  92%  94%  Weight:      Height:      PainSc:   1  Isolation Precautions No active isolations  Medications Medications  lidocaine (LIDODERM) 5 % 1-3 patch (1 patch Transdermal Patch Applied 07/27/23 1709)  acetaminophen (TYLENOL) tablet 1,000 mg (1,000 mg Oral Given 07/27/23 1709)  traMADol (ULTRAM) tablet 50 mg (50 mg Oral Given 07/27/23 2305)    Mobility non-ambulatory     Focused Assessments Back pain, brace TSLO on   R Recommendations: See Admitting Provider Note  Report given to:   Additional Notes: pt. Has only been rolling from side to side. Pt. Has great difficulty moving to sitting position due to pain

## 2023-07-28 NOTE — Assessment & Plan Note (Signed)
Secondary to mechanical fall. Neurosurgery is recommending TLSO and pain management PT OT are recommending SNF -Continue with pain management

## 2023-07-28 NOTE — Assessment & Plan Note (Signed)
Renal function seems stable at baseline. -Monitor renal function -Avoid nephrotoxins

## 2023-07-29 DIAGNOSIS — E785 Hyperlipidemia, unspecified: Secondary | ICD-10-CM

## 2023-07-29 DIAGNOSIS — S32019A Unspecified fracture of first lumbar vertebra, initial encounter for closed fracture: Secondary | ICD-10-CM | POA: Diagnosis not present

## 2023-07-29 DIAGNOSIS — E1122 Type 2 diabetes mellitus with diabetic chronic kidney disease: Secondary | ICD-10-CM | POA: Diagnosis not present

## 2023-07-29 DIAGNOSIS — I10 Essential (primary) hypertension: Secondary | ICD-10-CM | POA: Diagnosis not present

## 2023-07-29 DIAGNOSIS — N1832 Chronic kidney disease, stage 3b: Secondary | ICD-10-CM | POA: Diagnosis not present

## 2023-07-29 LAB — GLUCOSE, CAPILLARY
Glucose-Capillary: 144 mg/dL — ABNORMAL HIGH (ref 70–99)
Glucose-Capillary: 199 mg/dL — ABNORMAL HIGH (ref 70–99)

## 2023-07-29 MED ORDER — OXYCODONE HCL 5 MG PO TABS: 5.0000 mg | ORAL_TABLET | Freq: Four times a day (QID) | ORAL | 0 refills | Status: DC | PRN

## 2023-07-29 MED ORDER — VITAMIN B-12 100 MCG PO TABS: 100.0000 ug | ORAL_TABLET | Freq: Every day | ORAL | Status: AC

## 2023-07-29 NOTE — Progress Notes (Signed)
Patient discharged to [facility]. VSS. Respirations are even and unlabored. RA. NAD. Patient denies CP or SOB. PIV removed, tip intact.    AVS packet and all personal belongings sent with patient. Patient transported via [facility] (ambulance). Patient's family members aware of transport.

## 2023-07-29 NOTE — Progress Notes (Signed)
Report called to Grafton Folk at [employer] [phone]. Writer answered all questions.

## 2023-07-29 NOTE — Progress Notes (Signed)
Physical Therapy Treatment Patient Details Name: Gabriela Jefferson MRN: [id number] DOB: [birth year] Today's Date: 07/29/2023   History of Present Illness 89 y.o. female admitted 10/8 with back pain following 2 falls at home. CT lumbar spine revealed acute or subacute fracture involving the L1 inferior endplate with extension to the posterior cortex and slight buckling.  PMHx:  hypertension, hypothyroidism, type 2 diabetes, GERD, CKD 3B.    PT Comments  A bit more fatigued today when attempting to ambulate, only tolerating up to 10 feet with RW and min assist for control. Mod assist for boost to stand from various surfaces; pt with delayed processing and does much better if assist is provided for boost after she initiates the rise. With episode of urinary incontinence x2 when attempting to rise, reports not an issue at baseline however is in a lot of pain with transitions. Denies radicular symptoms, no saddle paraesthesias. Reviewed precautions, brace use (do not wear while supine in bed,) and log roll technique. Patient will continue to benefit from skilled physical therapy services to further improve independence with functional mobility.    If plan is discharge home, recommend the following: A lot of help with walking and/or transfers;A lot of help with bathing/dressing/bathroom;Assistance with cooking/housework;Assist for transportation;Help with stairs or ramp for entrance   Can travel by private vehicle     No (has regressed slightly today.)  Equipment Recommendations  None recommended by PT    Recommendations for Other Services       Precautions / Restrictions Precautions Precautions: Back;Fall Precaution Booklet Issued: Yes (comment) Precaution Comments: Reviewed precautions Required Braces or Orthoses: Spinal Brace Spinal Brace: Thoracolumbosacral orthotic;Applied in sitting position Restrictions Weight Bearing Restrictions: No     Mobility  Bed Mobility Overal bed  mobility: Needs Assistance Bed Mobility: Rolling, Sidelying to Sit Rolling: Min assist, Used rails Sidelying to sit: Max assist, HOB elevated, Used rails       General bed mobility comments: Min assist to roll and bring LEs out of bed, holding rail to assist. Max assist with transition to seated position from sidelying with max VC. Pt holding rail and slow to rise due to pain but improves once vertical.    Transfers Overall transfer level: Needs assistance Equipment used: Rolling walker (2 wheels) Transfers: Sit to/from Stand, Bed to chair/wheelchair/BSC Sit to Stand: Mod assist   Step pivot transfers: Min assist       General transfer comment: Mod assist for boost to stand from bed and recliner. Pt with delayed processing and when counting down to rise, does not engage at the same time therapist assists with rise, making transition much more difficult and uncomfortable for pt. When allowing pt to initiate followed by PT assist shortly after, there is a smoother transition however she still requires a heavy mod assist from lower surfaces. Min assist for step pivot transfers.    Ambulation/Gait Ambulation/Gait assistance: Min assist Gait Distance (Feet): 10 Feet Assistive device: Rolling walker (2 wheels) Gait Pattern/deviations: Step-through pattern, Decreased stride length, Trunk flexed, Antalgic Gait velocity: decr Gait velocity interpretation: <1.31 ft/sec, indicative of household ambulator   General Gait Details: Min assist, 5 feet foward and backwards for total of 10 feet, needs help with RW control and tactile cues for upright posture. Pt fatigues rapidly and was limited with further gait progression today for this reason.   Stairs             Wheelchair Mobility     Tilt Bed  Modified Rankin (Stroke Patients Only)       Balance Overall balance assessment: Needs assistance, History of Falls Sitting-balance support: Feet supported, No upper extremity  supported Sitting balance-Leahy Scale: Fair Sitting balance - Comments: Can sit EOB and edge of chair without UE support but is effortful.   Standing balance support: Bilateral upper extremity supported, During functional activity, Reliant on assistive device for balance Standing balance-Leahy Scale: Poor                              Cognition Arousal: Alert Behavior During Therapy: WFL for tasks assessed/performed Overall Cognitive Status: Impaired/Different from baseline Area of Impairment: Safety/judgement, Problem solving, Following commands, Memory                     Memory: Decreased recall of precautions Following Commands: Follows one step commands with increased time, Follows one step commands inconsistently Safety/Judgement: Decreased awareness of deficits, Decreased awareness of safety   Problem Solving: Decreased initiation, Difficulty sequencing, Requires verbal cues, Slow processing, Requires tactile cues General Comments: Intermittent tactile cues for directions with hand placement. Delayed processing.        Exercises General Exercises - Lower Extremity Ankle Circles/Pumps: AROM, Both, 10 reps, Seated Gluteal Sets: Strengthening, Both, 10 reps, Seated Long Arc Quad: Strengthening, Both, 10 reps, Seated    General Comments General comments (skin integrity, edema, etc.): Incontinent of urine each time she stood today. RN notified. Denies saddle paraesthesia or radicular symptoms into LEs. She reports no hx of incontinence prior to injury.      Pertinent Vitals/Pain Pain Assessment Pain Assessment: Faces Faces Pain Scale: Hurts even more Pain Location: back with transfer Pain Descriptors / Indicators: Aching, Grimacing, Discomfort Pain Intervention(s): Monitored during session, Repositioned, Limited activity within patient's tolerance    Home Living                          Prior Function            PT Goals (current goals  can now be found in the care plan section) Acute Rehab PT Goals Patient Stated Goal: Feel better PT Goal Formulation: With patient Time For Goal Achievement: 08/11/23 Potential to Achieve Goals: Good Progress towards PT goals: Progressing toward goals    Frequency    Min 1X/week      PT Plan      Co-evaluation              AM-PAC PT "6 Clicks" Mobility   Outcome Measure  Help needed turning from your back to your side while in a flat bed without using bedrails?: A Little Help needed moving from lying on your back to sitting on the side of a flat bed without using bedrails?: A Lot Help needed moving to and from a bed to a chair (including a wheelchair)?: A Lot Help needed standing up from a chair using your arms (e.g., wheelchair or bedside chair)?: A Lot Help needed to walk in hospital room?: A Little Help needed climbing 3-5 steps with a railing? : Total 6 Click Score: 13    End of Session Equipment Utilized During Treatment: Gait belt;Back brace Activity Tolerance: Patient limited by fatigue Patient left: with call bell/phone within reach;with family/visitor present;in chair;with chair alarm set Nurse Communication: Mobility status PT Visit Diagnosis: Pain;Difficulty in walking, not elsewhere classified (R26.2);History of falling (Z91.81);Repeated falls (R29.6);Unsteadiness on feet (R26.81)  Pain - part of body:  (back)     Time: 1028-1050 PT Time Calculation (min) (ACUTE ONLY): 22 min  Charges:    $Therapeutic Activity: 8-22 mins PT General Charges $$ ACUTE PT VISIT: 1 Visit                     Kathlyn [place], PT, DPT [facility]  Rehabilitation Services Physical Therapist Office: [phone] Website: [email or web address]    Berton Mount 07/29/2023, 11:20 AM

## 2023-07-29 NOTE — TOC Transition Note (Signed)
Transition of Care [facility]) - CM/SW Discharge Note   Patient Details  Name: Gabriela Jefferson MRN: [id number] Date of Birth: 11-18-1931  [facility] CM/SW Contact:  Deangelo Berns Felipa Emory, Student-Social Work Phone Number: 07/29/2023, 11:29 AM   Clinical Narrative:   MSW Student received insurance authorization for patient to admit to [facility]. MD confirmed patient is medically stable. MSW student updated [facility], sent dc information. [facility] ready to accept. MSW student met with patient to discuss transfer to SNF she is in agreement. Transport arraigned with [facility] for next available    Nurse to call report: [phone] Rm:[phone]   Final next level of care: Skilled Nursing Facility Barriers to Discharge: Barriers Resolved   Patient Goals and CMS Choice CMS Medicare.gov Compare Post Acute Care list provided to:: Patient Choice offered to / list presented to : Patient  Discharge Placement                Patient [facility] bed at: [facility] Patient to be transferred to facility by: [facility] Name of family member notified: Self Patient and family notified of of transfer: 07/29/23  Discharge Plan and Services Additional resources added to the After Visit Summary for       Post Acute Care Choice: Skilled Nursing Facility                               Social Determinants of Health (SDOH) Interventions SDOH Screenings   Food Insecurity: No Food Insecurity (07/28/2023)  Housing: Low Risk  (07/28/2023)  Transportation Needs: No Transportation Needs (07/28/2023)  Utilities: Not At Risk (07/28/2023)  Depression (PHQ2-9): Low Risk  (04/25/2020)  Social Connections: Unknown (08/11/2019)  Tobacco Use: Low Risk  (07/27/2023)     Readmission Risk Interventions     No data to display

## 2023-07-29 NOTE — Discharge Summary (Signed)
Physician Discharge Summary   Patient: Gabriela Jefferson MRN: [id number] DOB: [birth year]  Admit date:     07/27/2023  Discharge date: 07/29/23  Discharge Physician: Arnetha Courser   PCP: Creola Corn, MD   Recommendations at discharge:  Please obtain CBC and BMP in 1 week Follow-up with neuro/spinal surgery Follow-up with primary care provider  Discharge Diagnoses: Principal Problem:   Closed L1 vertebral fracture (HCC) Active Problems:   Type 2 diabetes mellitus with chronic kidney disease, without long-term current use of insulin (HCC)   Chronic kidney disease, stage 3b (HCC)   Essential hypertension   Hypothyroidism   Hyperlipidemia   Hospital Course: Taken from H&P.  Gabriela Jefferson is a 89 y.o. female with medical history significant for hypertension, hypothyroidism, type 2 diabetes, GERD, CKD 3B, who initially presented to [facility] ED after a mechanical fall.  She fell over the weekend while trying to open a window, since then she was having back pain.  She fell again on the day of admission while standing from a chair, hit her head, no loss of consciousness.  On arrival to ED CT head with no acute intracranial abnormality.  CT lumbar spine revealed acute/subacute fracture involving L1 inferior endplate with extension to the posterior cortex and slight buckling.  Neurosurgery was consulted from ED, they recommend pain control, TLSO brace and outpatient follow-up.  10/9: Vital and labs stable stable.  A1c of 6.9.  Continue to have pain with ambulation, PT and OT are recommending SNF.  TOC was consulted  10/10: Patient remained stable.  Able to work with PT.  Pain seems well-controlled with current regimen.  Although nausea and vomiting was listed with OxyContin, she was able to take oxycodone without any difficulty.  Patient should be tried Tylenol first and if that does not help then take narcotic.  Please give her regular bowel regimen if taking narcotic to avoid  constipation. Patient will continue with TLSO brace all the time.  She will follow-up with spinal surgery as outpatient for further recommendations.  Patient will continue on current medications and need to have a close follow-up with her providers for further management.   Assessment and Plan: * Closed L1 vertebral fracture (HCC) Secondary to mechanical fall. Neurosurgery is recommending TLSO and pain management PT OT are recommending SNF -Continue with pain management  Type 2 diabetes mellitus with chronic kidney disease, without long-term current use of insulin (HCC) Patient was on metformin at home, seems well-controlled with A1c of 6.9. -Continue with SSI  Chronic kidney disease, stage 3b (HCC) Renal function seems stable at baseline. -Monitor renal function -Avoid nephrotoxins  Essential hypertension Blood pressure currently within goal. -Restarting home hydrochlorothiazide -Holding losartan which can be added as needed  Hypothyroidism -Continue Synthroid  Hyperlipidemia -Continue statin   Pain control - [place] Controlled Substance Reporting System database was reviewed. and patient was instructed, not to drive, operate heavy machinery, perform activities at heights, swimming or participation in water activities or provide baby-sitting services while on Pain, Sleep and Anxiety Medications; until their outpatient Physician has advised to do so again. Also recommended to not to take more than prescribed Pain, Sleep and Anxiety Medications.  Consultants: Neurosurgery Procedures performed: None Disposition: Skilled nursing facility Diet recommendation:  Discharge Diet Orders (From admission, onward)     Start     Ordered   07/29/23 0000  Diet - low sodium heart healthy        07/29/23 1101  Cardiac and Carb modified diet DISCHARGE MEDICATION: Allergies as of 07/29/2023       Reactions   Oxycontin [oxycodone] Nausea And Vomiting         Medication List     TAKE these medications    acetaminophen 325 MG tablet Commonly known as: TYLENOL Take 2 tablets (650 mg total) by mouth every 6 (six) hours.   cyclobenzaprine 5 MG tablet Commonly known as: FLEXERIL Take 5 mg by mouth 2 (two) times daily as needed for muscle spasms.   hydrochlorothiazide 12.5 MG capsule Commonly known as: MICROZIDE Take 12.5 mg by mouth daily in the afternoon.   levothyroxine 50 MCG tablet Commonly known as: SYNTHROID Take 50-100 mcg by mouth See admin instructions. Take 100 mcg (2 tablet) daily before breakfast on Tuesday, Thursday and take 50 mcg (1 tablet) before breakfast on all other days   losartan 50 MG tablet Commonly known as: COZAAR Take 50 mg by mouth daily in the afternoon.   metFORMIN 500 MG tablet Commonly known as: GLUCOPHAGE Take 1,000 mg by mouth daily in the afternoon.   oxyCODONE 5 MG immediate release tablet Commonly known as: Oxy IR/ROXICODONE Take 1 tablet (5 mg total) by mouth every 6 (six) hours as needed for moderate pain or breakthrough pain.   polyethylene glycol 17 g packet Commonly known as: MIRALAX / GLYCOLAX Take 17 g by mouth daily. What changed:  how much to take when to take this   rosuvastatin 10 MG tablet Commonly known as: CRESTOR Take 10 mg by mouth daily in the afternoon.   VITAMIN B-12 PO Take 1 tablet by mouth daily in the afternoon.   vitamin B-12 100 MCG tablet Commonly known as: CYANOCOBALAMIN Take 1 tablet (100 mcg total) by mouth daily.   VITAMIN D-3 PO Take 1 capsule by mouth daily in the afternoon.        Contact information for follow-up providers     Creola Corn, MD. Schedule an appointment as soon as possible for a visit in 1 week(s).   Specialty: Internal Medicine Contact information: [address] [place] [address] [phone]              Contact information for after-discharge care     Destination     [facility] Preferred SNF .    Service: Skilled Nursing Contact information: [address] [place] [address] [phone]                    Discharge Exam: Ceasar Mons Weights   07/27/23 1547  Weight: 84.5 kg   General.  Frail elderly lady, in no acute distress. Pulmonary.  Lungs clear bilaterally, normal respiratory effort. CV.  Regular rate and rhythm, no JVD, rub or murmur. Abdomen.  Soft, nontender, nondistended, BS positive. CNS.  Alert and oriented .  No focal neurologic deficit. Extremities.  No edema, no cyanosis, pulses intact and symmetrical.  Condition at discharge: stable  The results of significant diagnostics from this hospitalization (including imaging, microbiology, ancillary and laboratory) are listed below for reference.   Imaging Studies: CT Head Wo Contrast  Result Date: 07/27/2023 CLINICAL DATA:  Head trauma, minor (Age >= 65y); Neck trauma (Age >= 65y); Back trauma, no prior imaging (Age >= 16y) EXAM: CT HEAD WITHOUT CONTRAST CT CERVICAL SPINE WITHOUT CONTRAST CT LUMBAR SPINE WITHOUT CONTRAST TECHNIQUE: Multidetector CT imaging of the head, cervical and lumbar spine was performed following the standard protocol without intravenous contrast. Multiplanar CT image reconstructions of the  cervical and lumbar spine were also generated. RADIATION DOSE REDUCTION: This exam was performed according to the departmental dose-optimization program which includes automated exposure control, adjustment of the mA and/or kV according to patient size and/or use of iterative reconstruction technique. COMPARISON:  None Available. FINDINGS: CT HEAD FINDINGS Brain: No evidence of acute infarction, hemorrhage, hydrocephalus, extra-axial collection or mass lesion/mass effect. Patchy white matter hypodensities are nonspecific but compatible with chronic microvascular ischemic disease. Vascular: Calcific atherosclerosis. Skull: No acute fracture. Sinuses/Orbits: Clear sinuses.  No acute orbital  findings. Other: No mastoid effusions. CT CERVICAL SPINE FINDINGS Alignment: No substantial sagittal subluxation.  Broad levocurvature Skull base and vertebrae: No acute fracture.  Osteopenia. Soft tissues and spinal canal: No prevertebral fluid or swelling. No visible canal hematoma. Disc levels: Multilevel degenerative disc disease, greatest at C5-C6 where there is endplate spurring and posterior disc/osteophyte complex. Right foraminal stenosis at this level. Upper chest: Visualized lung apices are clear. CT LUMBAR SPINE FINDINGS Alignment: Grade 1 anterolisthesis of L4 on L5. Vertebral bodies: Linear lucency through the inferior L1 vertebral body with extension to the posterior cortex, compatible with a fracture that is probably acute or subacute. No significant height loss. Mild associated buckling of the posterior cortex without significant canal stenosis. Osteopenia. Disc levels: Severe degenerative change at L4-L5 on the left where there is disc height loss, vacuum disc phenomenon and endplate spurring. Paraspinal: Aorto bi-iliac calcific atherosclerosis. IMPRESSION: CT lumbar spine: 1. Probably acute or subacute fracture involving the L1 inferior endplate with extension to the posterior cortex and slight buckling. No significant height loss. 2. Severe degenerative change at L4-L5 on the left. CT head: No evidence of acute intracranial abnormality. CT cervical spine: 1. No evidence of acute fracture or traumatic malalignment in the cervical spine. 2. Degenerative changes at C5-C6 where there is right foraminal stenosis. Findings discussed with Dr. Theresia Lo via telephone at 6:56 p.m. Electronically Signed   By: Feliberto Harts M.D.   On: 07/27/2023 18:58   CT Cervical Spine Wo Contrast  Result Date: 07/27/2023 CLINICAL DATA:  Head trauma, minor (Age >= 65y); Neck trauma (Age >= 65y); Back trauma, no prior imaging (Age >= 16y) EXAM: CT HEAD WITHOUT CONTRAST CT CERVICAL SPINE WITHOUT CONTRAST CT LUMBAR  SPINE WITHOUT CONTRAST TECHNIQUE: Multidetector CT imaging of the head, cervical and lumbar spine was performed following the standard protocol without intravenous contrast. Multiplanar CT image reconstructions of the cervical and lumbar spine were also generated. RADIATION DOSE REDUCTION: This exam was performed according to the departmental dose-optimization program which includes automated exposure control, adjustment of the mA and/or kV according to patient size and/or use of iterative reconstruction technique. COMPARISON:  None Available. FINDINGS: CT HEAD FINDINGS Brain: No evidence of acute infarction, hemorrhage, hydrocephalus, extra-axial collection or mass lesion/mass effect. Patchy white matter hypodensities are nonspecific but compatible with chronic microvascular ischemic disease. Vascular: Calcific atherosclerosis. Skull: No acute fracture. Sinuses/Orbits: Clear sinuses.  No acute orbital findings. Other: No mastoid effusions. CT CERVICAL SPINE FINDINGS Alignment: No substantial sagittal subluxation.  Broad levocurvature Skull base and vertebrae: No acute fracture.  Osteopenia. Soft tissues and spinal canal: No prevertebral fluid or swelling. No visible canal hematoma. Disc levels: Multilevel degenerative disc disease, greatest at C5-C6 where there is endplate spurring and posterior disc/osteophyte complex. Right foraminal stenosis at this level. Upper chest: Visualized lung apices are clear. CT LUMBAR SPINE FINDINGS Alignment: Grade 1 anterolisthesis of L4 on L5. Vertebral bodies: Linear lucency through the inferior L1 vertebral body with extension to  the posterior cortex, compatible with a fracture that is probably acute or subacute. No significant height loss. Mild associated buckling of the posterior cortex without significant canal stenosis. Osteopenia. Disc levels: Severe degenerative change at L4-L5 on the left where there is disc height loss, vacuum disc phenomenon and endplate spurring.  Paraspinal: Aorto bi-iliac calcific atherosclerosis. IMPRESSION: CT lumbar spine: 1. Probably acute or subacute fracture involving the L1 inferior endplate with extension to the posterior cortex and slight buckling. No significant height loss. 2. Severe degenerative change at L4-L5 on the left. CT head: No evidence of acute intracranial abnormality. CT cervical spine: 1. No evidence of acute fracture or traumatic malalignment in the cervical spine. 2. Degenerative changes at C5-C6 where there is right foraminal stenosis. Findings discussed with Dr. Theresia Lo via telephone at 6:56 p.m. Electronically Signed   By: Feliberto Harts M.D.   On: 07/27/2023 18:58   CT Lumbar Spine Wo Contrast  Result Date: 07/27/2023 CLINICAL DATA:  Head trauma, minor (Age >= 65y); Neck trauma (Age >= 65y); Back trauma, no prior imaging (Age >= 16y) EXAM: CT HEAD WITHOUT CONTRAST CT CERVICAL SPINE WITHOUT CONTRAST CT LUMBAR SPINE WITHOUT CONTRAST TECHNIQUE: Multidetector CT imaging of the head, cervical and lumbar spine was performed following the standard protocol without intravenous contrast. Multiplanar CT image reconstructions of the cervical and lumbar spine were also generated. RADIATION DOSE REDUCTION: This exam was performed according to the departmental dose-optimization program which includes automated exposure control, adjustment of the mA and/or kV according to patient size and/or use of iterative reconstruction technique. COMPARISON:  None Available. FINDINGS: CT HEAD FINDINGS Brain: No evidence of acute infarction, hemorrhage, hydrocephalus, extra-axial collection or mass lesion/mass effect. Patchy white matter hypodensities are nonspecific but compatible with chronic microvascular ischemic disease. Vascular: Calcific atherosclerosis. Skull: No acute fracture. Sinuses/Orbits: Clear sinuses.  No acute orbital findings. Other: No mastoid effusions. CT CERVICAL SPINE FINDINGS Alignment: No substantial sagittal subluxation.   Broad levocurvature Skull base and vertebrae: No acute fracture.  Osteopenia. Soft tissues and spinal canal: No prevertebral fluid or swelling. No visible canal hematoma. Disc levels: Multilevel degenerative disc disease, greatest at C5-C6 where there is endplate spurring and posterior disc/osteophyte complex. Right foraminal stenosis at this level. Upper chest: Visualized lung apices are clear. CT LUMBAR SPINE FINDINGS Alignment: Grade 1 anterolisthesis of L4 on L5. Vertebral bodies: Linear lucency through the inferior L1 vertebral body with extension to the posterior cortex, compatible with a fracture that is probably acute or subacute. No significant height loss. Mild associated buckling of the posterior cortex without significant canal stenosis. Osteopenia. Disc levels: Severe degenerative change at L4-L5 on the left where there is disc height loss, vacuum disc phenomenon and endplate spurring. Paraspinal: Aorto bi-iliac calcific atherosclerosis. IMPRESSION: CT lumbar spine: 1. Probably acute or subacute fracture involving the L1 inferior endplate with extension to the posterior cortex and slight buckling. No significant height loss. 2. Severe degenerative change at L4-L5 on the left. CT head: No evidence of acute intracranial abnormality. CT cervical spine: 1. No evidence of acute fracture or traumatic malalignment in the cervical spine. 2. Degenerative changes at C5-C6 where there is right foraminal stenosis. Findings discussed with Dr. Theresia Lo via telephone at 6:56 p.m. Electronically Signed   By: Feliberto Harts M.D.   On: 07/27/2023 18:58    Microbiology: Results for orders placed or performed during the hospital encounter of 01/24/18  MRSA PCR Screening     Status: None   Collection Time: 01/25/18  1:19 AM  Specimen: Nasal Mucosa; Nasopharyngeal  Result Value Ref Range Status   MRSA by PCR NEGATIVE NEGATIVE Final    Comment:        The GeneXpert MRSA Assay (FDA approved for NASAL  specimens only), is one component of a comprehensive MRSA colonization surveillance program. It is not intended to diagnose MRSA infection nor to guide or monitor treatment for MRSA infections. Performed at [facility], [address] [place], [place] [address]     Labs: CBC: Recent Labs  Lab 07/27/23 2205 07/28/23 0516  WBC 7.8 7.1  NEUTROABS 5.9  --   HGB 11.2* 10.3*  HCT 34.0* 31.8*  MCV 89.2 89.8  PLT 207 201   Basic Metabolic Panel: Recent Labs  Lab 07/27/23 2205 07/28/23 0516  NA 137 138  K 4.3 3.9  CL 103 105  CO2 23 24  GLUCOSE 194* 137*  BUN 23 20  CREATININE 1.35* 1.27*  CALCIUM 8.9 8.6*  MG  --  1.8  PHOS  --  3.8   Liver Function Tests: No results for input(s): "AST", "ALT", "ALKPHOS", "BILITOT", "PROT", "ALBUMIN" in the last 168 hours. CBG: Recent Labs  Lab 07/28/23 0631 07/28/23 1215 07/28/23 1632 07/28/23 2130 07/29/23 0609  GLUCAP 130* 145* 164* 149* 144*    Discharge time spent: greater than 30 minutes.  This record has been created using Conservation officer, historic buildings. Errors have been sought and corrected,but may not always be located. Such creation errors do not reflect on the standard of care.   Signed: Arnetha Courser, MD [facility] 07/29/2023

## 2023-10-13 ENCOUNTER — Emergency Department (HOSPITAL_COMMUNITY): Payer: Medicare Other

## 2023-10-13 ENCOUNTER — Encounter (HOSPITAL_COMMUNITY): Payer: Self-pay | Admitting: Emergency Medicine

## 2023-10-13 ENCOUNTER — Emergency Department (HOSPITAL_COMMUNITY)
Admission: EM | Admit: 2023-10-13 | Discharge: 2023-10-13 | Disposition: A | Discharge status: Home / Self Care | Payer: Medicare Other | Attending: Emergency Medicine | Admitting: Emergency Medicine

## 2023-10-13 DIAGNOSIS — M545 Low back pain, unspecified: Secondary | ICD-10-CM | POA: Diagnosis present

## 2023-10-13 DIAGNOSIS — S32011A Stable burst fracture of first lumbar vertebra, initial encounter for closed fracture: Secondary | ICD-10-CM | POA: Diagnosis not present

## 2023-10-13 DIAGNOSIS — W19XXXA Unspecified fall, initial encounter: Secondary | ICD-10-CM | POA: Insufficient documentation

## 2023-10-13 LAB — CBC WITH DIFFERENTIAL/PLATELET
Abs Immature Granulocytes: 0.03 10*3/uL (ref 0.00–0.07)
Basophils Absolute: 0 10*3/uL (ref 0.0–0.1)
Basophils Relative: 1 %
Eosinophils Absolute: 0 10*3/uL (ref 0.0–0.5)
Eosinophils Relative: 0 %
HCT: 37.2 % (ref 36.0–46.0)
Hemoglobin: 11.9 g/dL — ABNORMAL LOW (ref 12.0–15.0)
Immature Granulocytes: 0 %
Lymphocytes Relative: 13 %
Lymphs Abs: 1 10*3/uL (ref 0.7–4.0)
MCH: 30.1 pg (ref 26.0–34.0)
MCHC: 32 g/dL (ref 30.0–36.0)
MCV: 93.9 fL (ref 80.0–100.0)
Monocytes Absolute: 0.6 10*3/uL (ref 0.1–1.0)
Monocytes Relative: 7 %
Neutro Abs: 6 10*3/uL (ref 1.7–7.7)
Neutrophils Relative %: 79 %
Platelets: 258 10*3/uL (ref 150–400)
RBC: 3.96 MIL/uL (ref 3.87–5.11)
RDW: 13.1 % (ref 11.5–15.5)
WBC: 7.6 10*3/uL (ref 4.0–10.5)
nRBC: 0 % (ref 0.0–0.2)

## 2023-10-13 LAB — I-STAT CHEM 8, ED
BUN: 25 mg/dL — ABNORMAL HIGH (ref 8–23)
Calcium, Ion: 1.16 mmol/L (ref 1.15–1.40)
Chloride: 105 mmol/L (ref 98–111)
Creatinine, Ser: 1.4 mg/dL — ABNORMAL HIGH (ref 0.44–1.00)
Glucose, Bld: 175 mg/dL — ABNORMAL HIGH (ref 70–99)
HCT: 35 % — ABNORMAL LOW (ref 36.0–46.0)
Hemoglobin: 11.9 g/dL — ABNORMAL LOW (ref 12.0–15.0)
Potassium: 4.2 mmol/L (ref 3.5–5.1)
Sodium: 140 mmol/L (ref 135–145)
TCO2: 24 mmol/L (ref 22–32)

## 2023-10-13 LAB — BASIC METABOLIC PANEL
Anion gap: 12 (ref 5–15)
BUN: 26 mg/dL — ABNORMAL HIGH (ref 8–23)
CO2: 22 mmol/L (ref 22–32)
Calcium: 8.9 mg/dL (ref 8.9–10.3)
Chloride: 105 mmol/L (ref 98–111)
Creatinine, Ser: 1.41 mg/dL — ABNORMAL HIGH (ref 0.44–1.00)
GFR, Estimated: 36 mL/min — ABNORMAL LOW (ref >60)
Glucose, Bld: 178 mg/dL — ABNORMAL HIGH (ref 70–99)
Potassium: 4.1 mmol/L (ref 3.5–5.1)
Sodium: 139 mmol/L (ref 135–145)

## 2023-10-13 MED ORDER — HYDROCODONE-ACETAMINOPHEN 5-325 MG PO TABS
1.0000 | ORAL_TABLET | Freq: Once | ORAL | Status: AC
  Administered 2023-10-13: 1 via ORAL
  Filled 2023-10-13: qty 1

## 2023-10-13 MED ORDER — ONDANSETRON 4 MG PO TBDP
4.0000 mg | ORAL_TABLET | Freq: Once | ORAL | Status: AC
  Administered 2023-10-13: 4 mg via ORAL
  Filled 2023-10-13: qty 1

## 2023-10-13 MED ORDER — HYDROCODONE-ACETAMINOPHEN 10-325 MG PO TABS: 0.5000 | ORAL_TABLET | Freq: Four times a day (QID) | ORAL | 0 refills | Status: AC | PRN

## 2023-10-13 NOTE — Discharge Instructions (Addendum)
Get help right away if: You have difficulty breathing. Your pain is very bad and it suddenly gets worse. You have numbness, tingling, or weakness in any part of your body. You are unable to empty your bladder. You cannot control when you urinate or have bowel movements. You are unable to move any body part that is below the level of your injury. You have pain in your abdomen or uncontrolled vomiting. You have a warm, tender swelling in your leg. These symptoms may be an emergency. Get help right away. Call 911. Do not wait to see if the symptoms will go away. Do not drive yourself to the hospital.

## 2023-10-13 NOTE — ED Triage Notes (Signed)
Pt is coming from home with back pain that is coming from a fall in October that caused a fracture in her back, went to rehab and she had a good recover, she had a fall on Saturday and has had increase in pain and reduction of mobility since then. She is coming today because of the pain and loss of mobility due to pain, she was not seen for this most recent fall. She has no other complaints at this time. Pt is otherwise stable.  Medic vitals   172/90 90hr 18rr 100%ra  20g left ac  fentanyl given

## 2023-10-13 NOTE — ED Triage Notes (Signed)
  Pt is coming from home with back pain that is coming from a fall in October that caused a fracture in her back, went to rehab and she had a good recover, she had a fall on Saturday and has had increase in pain and reduction of mobility since then. She is coming today because of the pain and loss of mobility due to pain, she was not seen for this most recent fall. She has no other complaints at this time. Pt is otherwise stable.   Medic vitals    172/90 90hr 18rr 100%ra   20g left ac   fentanyl given   Not placed by Drue Stager

## 2023-10-13 NOTE — ED Provider Notes (Incomplete)
[facility]PART[facility] AT [facility] Provider Note   CSN: [id number] Arrival date & time: 10/13/23  1536     History {Add pertinent medical, surgical, social history, OB history to HPI:1} Chief Complaint  Patient presents with   Back Pain    Gabriela Jefferson is a 89 y.o. female  Who presents emergency department with chief complaint of back pain.  Patient has a known compression fracture from previous fall.  Her family is at bedside states that she has had significant pain.  She does have a TLSO without a lot of relief of her symptoms.  She cannot sit up for long periods of time. She had a    Back Pain      Home Medications Prior to Admission medications   Medication Sig Start Date End Date Taking? Authorizing Provider  acetaminophen (TYLENOL) 325 MG tablet Take 2 tablets (650 mg total) by mouth every 6 (six) hours. 01/28/18   Meuth, Lina Sar, PA-C  Cholecalciferol (VITAMIN D-3 PO) Take 1 capsule by mouth daily in the afternoon.    [provider]  Cyanocobalamin (VITAMIN B-12 PO) Take 1 tablet by mouth daily in the afternoon.    [provider]  cyclobenzaprine (FLEXERIL) 5 MG tablet Take 5 mg by mouth 2 (two) times daily as needed for muscle spasms.    [provider]  hydrochlorothiazide (MICROZIDE) 12.5 MG capsule Take 12.5 mg by mouth daily in the afternoon.    [provider]  levothyroxine (SYNTHROID) 50 MCG tablet Take 50-100 mcg by mouth See admin instructions. Take 100 mcg (2 tablet) daily before breakfast on Tuesday, Thursday and take 50 mcg (1 tablet) before breakfast on all other days    [provider]  losartan (COZAAR) 50 MG tablet Take 50 mg by mouth daily in the afternoon.    [provider]  metFORMIN (GLUCOPHAGE) 500 MG tablet Take 1,000 mg by mouth daily in the afternoon.    [provider]  oxyCODONE (OXY IR/ROXICODONE) 5 MG immediate release tablet Take 1 tablet (5 mg total) by  mouth every 6 (six) hours as needed for moderate pain or breakthrough pain. 07/29/23   Arnetha Courser, MD  polyethylene glycol (MIRALAX / GLYCOLAX) packet Take 17 g by mouth daily. Patient taking differently: Take 5 g by mouth daily in the afternoon. 01/28/18   Meuth, Brooke A, PA-C  rosuvastatin (CRESTOR) 10 MG tablet Take 10 mg by mouth daily in the afternoon.    [provider]  vitamin B-12 (CYANOCOBALAMIN) 100 MCG tablet Take 1 tablet (100 mcg total) by mouth daily. 07/29/23   Arnetha Courser, MD      Allergies    Oxycontin [oxycodone]    Review of Systems   Review of Systems  Musculoskeletal:  Positive for back pain.    Physical Exam Updated Vital Signs BP (!) 153/60 (BP Location: Left Arm)   Pulse 80   Temp 98.5 F (36.9 C) (Oral)   SpO2 100%  Physical Exam  ED Results / Procedures / Treatments   Labs (all labs ordered are listed, but only abnormal results are displayed) Labs Reviewed - No data to display  EKG None  Radiology No results found.  Procedures Procedures  {Document cardiac monitor, telemetry assessment procedure when appropriate:1}  Medications Ordered in ED Medications - No data to display  ED Course/ Medical Decision Making/ A&P   {   Click here for ABCD2, HEART and other calculatorsREFRESH Note before signing :1}  Medical Decision Making  ***  {Document critical care time when appropriate:1} {Document review of labs and clinical decision tools ie heart score, Chads2Vasc2 etc:1}  {Document your independent review of radiology images, and any outside records:1} {Document your discussion with family members, caretakers, and with consultants:1} {Document social determinants of health affecting pt's care:1} {Document your decision making why or why not admission, treatments were needed:1} Final Clinical Impression(s) / ED Diagnoses Final diagnoses:  None    Rx / DC Orders ED Discharge Orders     None

## 2023-10-22 ENCOUNTER — Emergency Department (HOSPITAL_COMMUNITY)
Admission: EM | Admit: 2023-10-22 | Discharge: 2023-10-22 | Discharge status: Against Medical Advice | Payer: Medicare Other | Attending: Emergency Medicine | Admitting: Emergency Medicine

## 2023-10-22 ENCOUNTER — Emergency Department (HOSPITAL_COMMUNITY): Payer: Medicare Other

## 2023-10-22 ENCOUNTER — Encounter (HOSPITAL_COMMUNITY): Payer: Self-pay | Admitting: Emergency Medicine

## 2023-10-22 DIAGNOSIS — M25511 Pain in right shoulder: Secondary | ICD-10-CM | POA: Diagnosis not present

## 2023-10-22 DIAGNOSIS — R443 Hallucinations, unspecified: Secondary | ICD-10-CM | POA: Insufficient documentation

## 2023-10-22 DIAGNOSIS — M545 Low back pain, unspecified: Secondary | ICD-10-CM | POA: Insufficient documentation

## 2023-10-22 DIAGNOSIS — Z5321 Procedure and treatment not carried out due to patient leaving prior to being seen by health care provider: Secondary | ICD-10-CM | POA: Insufficient documentation

## 2023-10-22 DIAGNOSIS — M25551 Pain in right hip: Secondary | ICD-10-CM | POA: Insufficient documentation

## 2023-10-22 LAB — CBC WITH DIFFERENTIAL/PLATELET
Abs Immature Granulocytes: 0.03 10*3/uL (ref 0.00–0.07)
Basophils Absolute: 0 10*3/uL (ref 0.0–0.1)
Basophils Relative: 0 %
Eosinophils Absolute: 0 10*3/uL (ref 0.0–0.5)
Eosinophils Relative: 0 %
HCT: 38.7 % (ref 36.0–46.0)
Hemoglobin: 12.2 g/dL (ref 12.0–15.0)
Immature Granulocytes: 0 %
Lymphocytes Relative: 15 %
Lymphs Abs: 1.5 10*3/uL (ref 0.7–4.0)
MCH: 29.6 pg (ref 26.0–34.0)
MCHC: 31.5 g/dL (ref 30.0–36.0)
MCV: 93.9 fL (ref 80.0–100.0)
Monocytes Absolute: 0.7 10*3/uL (ref 0.1–1.0)
Monocytes Relative: 7 %
Neutro Abs: 7.2 10*3/uL (ref 1.7–7.7)
Neutrophils Relative %: 78 %
Platelets: 259 10*3/uL (ref 150–400)
RBC: 4.12 MIL/uL (ref 3.87–5.11)
RDW: 13 % (ref 11.5–15.5)
WBC: 9.4 10*3/uL (ref 4.0–10.5)
nRBC: 0 % (ref 0.0–0.2)

## 2023-10-22 LAB — BASIC METABOLIC PANEL
Anion gap: 12 (ref 5–15)
BUN: 26 mg/dL — ABNORMAL HIGH (ref 8–23)
CO2: 23 mmol/L (ref 22–32)
Calcium: 9.6 mg/dL (ref 8.9–10.3)
Chloride: 104 mmol/L (ref 98–111)
Creatinine, Ser: 1.2 mg/dL — ABNORMAL HIGH (ref 0.44–1.00)
GFR, Estimated: 43 mL/min — ABNORMAL LOW (ref >60)
Glucose, Bld: 157 mg/dL — ABNORMAL HIGH (ref 70–99)
Potassium: 4.6 mmol/L (ref 3.5–5.1)
Sodium: 139 mmol/L (ref 135–145)

## 2023-10-22 LAB — CBG MONITORING, ED: Glucose-Capillary: 138 mg/dL — ABNORMAL HIGH (ref 70–99)

## 2023-10-22 NOTE — ED Triage Notes (Signed)
 Pt with daughter who reports pt fell out of bed last night and was having hallucinations since yesterday. Pt has back brace on. Pain to right side from the fall.

## 2023-10-22 NOTE — ED Notes (Signed)
 Pt called 3 times, pt not in ER. Pt phone called and family member states they left

## 2023-10-22 NOTE — ED Notes (Signed)
 Pt. Called to triage for vital signs x 1, no answer.

## 2023-10-22 NOTE — ED Provider Triage Note (Signed)
 Emergency Medicine Provider Triage Evaluation Note  Gabriela Jefferson , a 89 y.o. female  was evaluated in triage.  Pt complains of fall and hallucinations.  Daughter at bedside provided history.  Daughter notes that patient was found on the ground this morning after falling out of bed.  Head was in a trash can.  Patient has a recent lumbar fracture and has a back brace on.  Patient admits to low back, right shoulder, and right hip pain.  Daughter also notes that patient has been hallucinating since yesterday which is atypical for patient.  Daughter notes some malodorous urine.  Patient denies any dysuria.  No fever or chills.  No psychiatric issues.  Review of Systems  Positive: Arthralgia, hallucinations Negative: fever  Physical Exam  BP (!) 152/83   Pulse 89   Temp 98.3 F (36.8 C) (Oral)   Resp 18   Ht 5' 6 (1.676 m)   Wt 85 kg   SpO2 100%   BMI 30.25 kg/m  Gen:   Awake, no distress   Resp:  Normal effort  MSK:   Moves extremities without difficulty  Other:    Medical Decision Making  Medically screening exam initiated at 4:44 PM.  Appropriate orders placed.  Gabriela Jefferson was informed that the remainder of the evaluation will be completed by another provider, this initial triage assessment does not replace that evaluation, and the importance of remaining in the ED until their evaluation is complete.  Labs UA CT/x-rays   Gabriela Jefferson, [place] 10/22/23 [id number]

## 2023-10-28 ENCOUNTER — Other Ambulatory Visit: Payer: Self-pay | Admitting: Neurosurgery

## 2023-10-28 DIAGNOSIS — S32011A Stable burst fracture of first lumbar vertebra, initial encounter for closed fracture: Secondary | ICD-10-CM
